# Patient Record
Sex: Female | Born: 1965 | Race: White | Hispanic: No | Marital: Married | Smoking: Former smoker
Health system: Southern US, Community
[De-identification: ages and names within clinical notes are randomized; demographics above are authoritative.]

## PROBLEM LIST (undated history)

## (undated) DIAGNOSIS — E785 Hyperlipidemia, unspecified: Secondary | ICD-10-CM

## (undated) DIAGNOSIS — E611 Iron deficiency: Secondary | ICD-10-CM

## (undated) DIAGNOSIS — I679 Cerebrovascular disease, unspecified: Secondary | ICD-10-CM

## (undated) DIAGNOSIS — N63 Unspecified lump in unspecified breast: Secondary | ICD-10-CM

## (undated) DIAGNOSIS — H409 Unspecified glaucoma: Secondary | ICD-10-CM

## (undated) DIAGNOSIS — N189 Chronic kidney disease, unspecified: Secondary | ICD-10-CM

## (undated) DIAGNOSIS — J449 Chronic obstructive pulmonary disease, unspecified: Secondary | ICD-10-CM

## (undated) DIAGNOSIS — E0500 Thyrotoxicosis with diffuse goiter without thyrotoxic crisis or storm: Secondary | ICD-10-CM

## (undated) DIAGNOSIS — I10 Essential (primary) hypertension: Secondary | ICD-10-CM

## (undated) DIAGNOSIS — C3490 Malignant neoplasm of unspecified part of unspecified bronchus or lung: Secondary | ICD-10-CM

## (undated) DIAGNOSIS — M318 Other specified necrotizing vasculopathies: Secondary | ICD-10-CM

## (undated) DIAGNOSIS — N184 Chronic kidney disease, stage 4 (severe): Secondary | ICD-10-CM

## (undated) DIAGNOSIS — D649 Anemia, unspecified: Secondary | ICD-10-CM

## (undated) DIAGNOSIS — N281 Cyst of kidney, acquired: Secondary | ICD-10-CM

## (undated) DIAGNOSIS — I779 Disorder of arteries and arterioles, unspecified: Secondary | ICD-10-CM

## (undated) DIAGNOSIS — I739 Peripheral vascular disease, unspecified: Secondary | ICD-10-CM

## (undated) DIAGNOSIS — N390 Urinary tract infection, site not specified: Secondary | ICD-10-CM

## (undated) DIAGNOSIS — I351 Nonrheumatic aortic (valve) insufficiency: Secondary | ICD-10-CM

## (undated) DIAGNOSIS — E538 Deficiency of other specified B group vitamins: Secondary | ICD-10-CM

## (undated) HISTORY — DX: Cyst of kidney, acquired: N28.1

## (undated) HISTORY — DX: Thyrotoxicosis with diffuse goiter without thyrotoxic crisis or storm: E05.00

## (undated) HISTORY — DX: Hyperlipidemia, unspecified: E78.5

## (undated) HISTORY — DX: Iron deficiency: E61.1

## (undated) HISTORY — DX: Chronic kidney disease, stage 4 (severe): N18.4

## (undated) HISTORY — DX: Other specified necrotizing vasculopathies: M31.8

## (undated) HISTORY — DX: Malignant neoplasm of unspecified part of unspecified bronchus or lung: C34.90

## (undated) HISTORY — DX: Urinary tract infection, site not specified: N39.0

## (undated) HISTORY — PX: COLONOSCOPY: SHX174

## (undated) HISTORY — DX: Chronic kidney disease, unspecified: N18.9

## (undated) HISTORY — PX: BRONCHOSCOPY: SUR163

## (undated) HISTORY — DX: Anemia, unspecified: D64.9

## (undated) HISTORY — DX: Chronic obstructive pulmonary disease, unspecified: J44.9

## (undated) HISTORY — DX: Unspecified glaucoma: H40.9

## (undated) HISTORY — DX: Disorder of arteries and arterioles, unspecified: I77.9

## (undated) HISTORY — DX: Unspecified lump in unspecified breast: N63.0

## (undated) HISTORY — DX: Nonrheumatic aortic (valve) insufficiency: I35.1

## (undated) HISTORY — DX: Cerebrovascular disease, unspecified: I67.9

## (undated) HISTORY — DX: Essential (primary) hypertension: I10

## (undated) HISTORY — DX: Peripheral vascular disease, unspecified: I73.9

## (undated) HISTORY — DX: Deficiency of other specified B group vitamins: E53.8

---

## 1992-02-24 HISTORY — PX: SPINAL FUSION: SHX223

## 1994-02-23 HISTORY — PX: ORIF ORBITAL FRACTURE: SHX5312

## 2007-01-31 ENCOUNTER — Encounter: Payer: Self-pay | Admitting: Cardiology

## 2007-04-19 ENCOUNTER — Ambulatory Visit: Payer: Self-pay | Admitting: Cardiology

## 2008-01-23 ENCOUNTER — Encounter: Payer: Self-pay | Admitting: Cardiology

## 2008-09-23 DIAGNOSIS — I679 Cerebrovascular disease, unspecified: Secondary | ICD-10-CM

## 2008-09-23 HISTORY — DX: Cerebrovascular disease, unspecified: I67.9

## 2008-10-19 ENCOUNTER — Ambulatory Visit: Payer: Self-pay | Admitting: Cardiology

## 2008-10-19 ENCOUNTER — Encounter: Payer: Self-pay | Admitting: Cardiology

## 2008-10-22 ENCOUNTER — Encounter: Payer: Self-pay | Admitting: Cardiology

## 2008-10-24 ENCOUNTER — Encounter: Payer: Self-pay | Admitting: Cardiology

## 2008-10-25 ENCOUNTER — Encounter: Payer: Self-pay | Admitting: Cardiology

## 2009-01-16 ENCOUNTER — Encounter: Payer: Self-pay | Admitting: Cardiology

## 2009-03-06 ENCOUNTER — Encounter: Payer: Self-pay | Admitting: Cardiology

## 2009-04-09 ENCOUNTER — Encounter: Payer: Self-pay | Admitting: Cardiology

## 2009-04-15 ENCOUNTER — Encounter: Payer: Self-pay | Admitting: Cardiology

## 2009-05-07 ENCOUNTER — Encounter: Payer: Self-pay | Admitting: Cardiology

## 2009-05-24 ENCOUNTER — Ambulatory Visit: Payer: Self-pay | Admitting: Cardiology

## 2009-05-24 DIAGNOSIS — I6529 Occlusion and stenosis of unspecified carotid artery: Secondary | ICD-10-CM

## 2009-05-24 DIAGNOSIS — I359 Nonrheumatic aortic valve disorder, unspecified: Secondary | ICD-10-CM | POA: Insufficient documentation

## 2009-05-28 ENCOUNTER — Ambulatory Visit: Payer: Self-pay | Admitting: Cardiology

## 2009-07-20 ENCOUNTER — Encounter: Payer: Self-pay | Admitting: Cardiology

## 2009-07-23 ENCOUNTER — Encounter (INDEPENDENT_AMBULATORY_CARE_PROVIDER_SITE_OTHER): Payer: Self-pay | Admitting: *Deleted

## 2009-08-05 ENCOUNTER — Encounter: Payer: Self-pay | Admitting: Cardiology

## 2009-11-18 ENCOUNTER — Ambulatory Visit: Payer: Self-pay | Admitting: Cardiology

## 2009-11-18 DIAGNOSIS — R945 Abnormal results of liver function studies: Secondary | ICD-10-CM

## 2009-11-18 DIAGNOSIS — E782 Mixed hyperlipidemia: Secondary | ICD-10-CM | POA: Insufficient documentation

## 2009-11-19 ENCOUNTER — Encounter: Payer: Self-pay | Admitting: Cardiology

## 2009-11-19 ENCOUNTER — Telehealth (INDEPENDENT_AMBULATORY_CARE_PROVIDER_SITE_OTHER): Payer: Self-pay | Admitting: *Deleted

## 2009-11-22 ENCOUNTER — Encounter: Payer: Self-pay | Admitting: Cardiology

## 2009-11-27 ENCOUNTER — Encounter (INDEPENDENT_AMBULATORY_CARE_PROVIDER_SITE_OTHER): Payer: Self-pay | Admitting: *Deleted

## 2010-03-25 NOTE — Consult Note (Signed)
Summary: CARDIOLOGY CONSULT/ [facility]  CARDIOLOGY CONSULT/ [facility]   Imported By: Zachary George 05/24/2009 11:25:04  _____________________________________________________________________  External Attachment:    Type:   Image     Comment:   External Document

## 2010-03-25 NOTE — Consult Note (Signed)
Summary: ONCOLOGY CONSULT  ONCOLOGY CONSULT   Imported By: Zachary George 05/24/2009 11:02:50  _____________________________________________________________________  External Attachment:    Type:   Image     Comment:   External Document

## 2010-03-25 NOTE — Letter (Signed)
Summary: Engineer, materials at [facility]  [address]   [place], [place] [address]   Phone: [phone]  Fax: [phone]        November 27, 2009 MRN: [id number]    CHANELE DOUGLAS [address] [place], [place]  [address]    Dear Ms. Hammersmith,  Your test ordered by Selena Batten has been reviewed by your physician (or physician assistant) and was found to be normal or stable. Your physician (or physician assistant) felt no changes were needed at this time.  ____ Echocardiogram  ____ Cardiac Stress Test  ____ Lab Work  __X__ Peripheral vascular study of arms, legs or neck  ____ CT scan or X-ray  ____ Lung or Breathing test  ____ Other:   Thank you.   Cyril Loosen, RN, BSN    Duane Boston, M.D., F.A.C.C. Thressa Sheller, M.D., F.A.C.C. Oneal Grout, M.D., F.A.C.C. Cheree Ditto, M.D., F.A.C.C. Daiva Nakayama, M.D., F.A.C.C. Kenney Houseman, M.D., F.A.C.C. Jeanne Ivan, PA-C

## 2010-03-25 NOTE — Letter (Signed)
Summary: Generic Engineer, agricultural at [facility] [address]   [place], [place] [address]   Phone: [phone]  Fax: [phone]        Jul 23, 2009 MRN: [id number]    ASENETH HACK [address] [place], [place]  [address]    Dear Ms. Rozzell,   According to our records, you are due for lab work to check your cholesterol and liver function following the addition of Simvastatin to your medications in April.   Please, take the enclosed order to the [facility] at your earliest convenience to have this lab work drawn. Do not eat or drink after midnight.       Sincerely,  Cyril Loosen, RN, BSN  This letter has been electronically signed by your physician.  Appended Document: Orders Update    Clinical Lists Changes  Orders: Added new Test order of T-Hepatic Function [phone]) - Signed Added new Test order of T-Lipid Profile [phone]) - Signed

## 2010-03-25 NOTE — Assessment & Plan Note (Signed)
Summary: EST-LAST SEEN 2009 HAS BEEN IN [facility]   Visit Type:  Hospital follow up Referring Provider:  Dr. Isabel Caprice Primary Provider:  Dr. Doreen Beam   History of Present Illness: 45 year old woman presents for a hospital followup visit. I saw her back in 2009 for cardiology consultation, not since that time. She was seen back in August of 2010 by Dr. Jens Som and Dr. Andee Lineman during inpatient admission at [facility] with a posterior circulation stroke. She was noted to have 50-70% bilateral internal carotid artery stenoses at that time, and echocardiography demonstrated an LVEF of 55-60% without wall motion abnormality, no left atrial appendage thrombus, moderate aortic regurgitation, and trace mitral regurgitation. No other intracardiac masses were noted. It was indicated at that time that the patient should have followup for her carotid artery disease, and otherwise would need a repeat echocardiogram over the next 6 months to assess her aortic valve.  She has been followed with anemia and systemic vasculitis by Dr. Corliss Skains and sees Dr. Pearlean Brownie for neurology followup. She indicates that she had carotid Dopplers done in followup since August, but cannot recall the details. She has a visit with him next month.  Ms. Hitch is transitioning her Coumadin followup from Dr. Cleone Slim and Dr. Sherril Croon. Today we discussed her cardiac testing from last year, including her aortic regurgitation, and findings of no specific cardiac dysrhythmia. She has had no palpitations or chest pain. She has NYHA class I dyspnea and exertion.  Current Medications (verified): 1)  Folic Acid 1 Mg Tabs (Folic Acid) .... Take 1 Tablet By Mouth Once A Day 2)  Metoprolol Tartrate 25 Mg Tabs (Metoprolol Tartrate) .... Take 1/2 Tablet By Mouth Once A Day 3)  Plaquenil 200 Mg Tabs (Hydroxychloroquine Sulfate) .... Take 1 Tablet By Mouth Once A Day 4)  Coumadin 5 Mg Tabs (Warfarin Sodium) .... Take 1 Tablet By Mouth Once A  Day 5)  Tramadol Hcl 50 Mg Tabs (Tramadol Hcl) .... Take 2 Tablet By Mouth Once A Day As Needed 6)  Proair Hfa 108 (90 Base) Mcg/act Aers (Albuterol Sulfate) .... As Needed 7)  Prilosec Otc 20 Mg Tbec (Omeprazole Magnesium) .... Take 1 Tablet By Mouth Once A Day  Allergies (verified): 1)  ! Sulfa  Comments:  Nurse/Medical Assistant: The patient's medications and allergies were reviewed with the patient and were updated in the Medication and Allergy Lists. List reviewed.  Past History:  Social History: Last updated: 05/20/2009 Married  Tobacco Use - Yes Alcohol Use - yes Regular Exercise - no  Past Medical History: Aortic Insufficiency - mild to moderate Asthma Reported breast nodule Anemia Systemic vasculitis, not otherwise specified - Dr. 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         She reports improved appetite, and appropriate weight gain. No orthopnea or PND. Otherwise reviewed and negative.   Vital Signs:  Patient profile:   45 year old female Height:      61 inches Weight:      109 pounds BMI:     20.67 Pulse rate:   64 / minute BP sitting:   166 / 71  (left arm) Cuff size:   regular  Vitals Entered By: Carlye Grippe (May 24, 2009 1:29 PM)  Physical Exam  Additional Exam:  Thin middle-aged woman in no acute distress.  HEENT: Conjunctivae was normal, oropharynx clear. Neck: Supple, no elevated jugular venous pressure, bilateral carotid bruits noted. Lungs: Clear to auscultation, nonlabored. Cardiac: Regular rate and rhythm, 2/6 systolic murmur at the base with soft diastolic murmur in the upper left sternal border, no S3. Abdomen: Soft, nontender, bowel sounds present. Extremities: No pitting edema, pulses 2+. Skin: Warm and  dry. Musculoskeletal: No kyphosis. Neuropsychiatric: Alert and oriented x3, affect appropriate.    TE Echocardiogram  Procedure date:  10/24/2008  Findings:      Transesophageal echocardiogram:  LVEF 55-60%, no intracardiac mass, no PFO, no left atrial appendage thrombus, moderate aortic regurgitation, trace mitral regurgitation, trace tricuspid regurgitation.  EKG  Procedure date:  05/24/2009  Findings:      Sinus bradycardia at 59 beats per minute.  Impression & Recommendations:  Problem # 1:  AORTIC VALVE DISORDERS (ICD-424.1)  Patient with known moderate aortic regurgitation, with long-standing cardiac murmur by history. Both transthoracic and transesophageal echocardiogram reports were reviewed from last year. Dr. Andee Lineman recommended a followup echocardiogram in 6 months during his inpatient evaluation. This will be arranged. I will otherwise plan to see her back clinically in 6 months.  Her updated medication list for this problem includes:    Metoprolol Tartrate 25 Mg Tabs (Metoprolol tartrate) .Marland Kitchen... Take 1/2 tablet by mouth once a day  Orders: 2-D Echocardiogram (2D Echo) T-Lipid Profile ([id number]) T-Hepatic Function [phone])  Problem # 2:  CAROTID ARTERY DISEASE (ICD-433.10)  Moderate bilateral internal carotid artery disease, 50-70% by carotid Dopplers last August. This has reportedly been followed up by Dr. Pearlean Brownie since that time, details pending. We will request records for review. She continues on Coumadin with prior history of posterior circulation stroke. She has had no definite dysrhythmia documented and is in sinus rhythm today. No intracardiac thrombi were noted on transesophageal echocardiogram from last August. She reports a pending visit with Dr. Pearlean Brownie next month. Coumadin is to be followed by Dr. Sherril Croon per patient report. She is transitioning this followup from Dr. Cleone Slim to Dr. Sherril Croon. We will arrange a fasting lipid profile and liver function tests, as  aggressive LDL management is warranted.  Her updated medication list for this problem includes:    Coumadin 5 Mg Tabs (Warfarin sodium) .Marland Kitchen... Take 1 tablet by mouth once a day  Orders: 2-D Echocardiogram (2D Echo) T-Lipid Profile ([id number]) T-Hepatic Function ([id number])  Other Orders: EKG w/ Interpretation ([id number])  Patient Instructions: 1)  Your physician wants you to follow-up in: 6 months. You will receive a reminder letter in the mail one-two months in advance. If you don't receive a letter, please call our office to schedule the follow-up appointment. 2)  Your physician recommends that you go to the [facility] for a FASTING lipid profile and liver function labs: DO NOT EAT OR DRINK AFTER MIDNIGHT. You may also do this at [facility] at the same time as your Echo. 3)  Your physician has requested that you have an echocardiogram.  Echocardiography is a painless test that uses sound waves to create images of your heart. It provides your doctor with information about the size and shape of your heart and how well your heart's chambers and valves are working.  This procedure takes approximately one hour. There are no restrictions for this procedure.

## 2010-03-25 NOTE — Letter (Signed)
Summary: External Correspondence/ [facility]  External Correspondence/ [facility]   Imported By: Dorise Hiss 05/24/2009 16:48:07  _____________________________________________________________________  External Attachment:    Type:   Image     Comment:   External Document

## 2010-03-25 NOTE — Letter (Signed)
Summary: [facility] Calkin - [facility]-OFFICE NOTE  [facility] Calkin - [facility]-OFFICE NOTE   Imported By: Claudette Laws 05/23/2009 15:18:54  _____________________________________________________________________  External Attachment:    Type:   Image     Comment:   External Document

## 2010-03-25 NOTE — Letter (Signed)
Summary: [facility] D/C DR. VYAS  [facility] D[facility]C DR. [facility]YAS   Imported By: Zachary George 05/24/2009 11:45:59  _____________________________________________________________________  External Attachment:    Type:   Image     Comment:   External Document

## 2010-03-25 NOTE — Progress Notes (Signed)
Summary: Carotid  ---- Converted from flag ---- ---- 11/19/2009 12:19 PM, Loreli Slot, MD, [facility] wrote: [facility] neurology sent copies of a prior transcranial Doppler report from 2010. Has she actually had followup carotid Dopplers within the last 6 months? If not, these need to be arranged. ------------------------------  Spoke with medical records at [facility]. Pt has not had recent carotid. Carotid will be ordered and pt will be notified of plan.  Appended Document: Orders Update    Clinical Lists Changes  Orders: Added new Referral order of Carotid Duplex (Carotid Duplex) - Signed

## 2010-03-25 NOTE — Letter (Signed)
Summary: External Correspondence/ OFFICE NOTE [facility]  External Correspondence/ OFFICE NOTE [facility]   Imported By: Dorise Hiss 11/19/2009 11:50:27  _____________________________________________________________________  External Attachment:    Type:   Image     Comment:   External Document

## 2010-03-25 NOTE — Assessment & Plan Note (Signed)
Summary: 6 mo fu   Visit Type:  Follow-up Referring Provider:  Dr. Isabel Caprice Primary Provider:  Dr. Doreen Beam   History of Present Illness: 45 year old Tasha [name] presents for followup. I saw her back in April.  Followup labs from 5 April showed AST 42, ALT 64, cholesterol 204, triglycerides 80, HDL 59, LDL 129. Simvastatin 10 mg daily was started. Followup labs from 27 May showed AST 345 and ALT 369. Simvastatin was discontinued. Followup labs from 13 June showed AST 61, ALT 112. She saw Dr. Karilyn Cota as well, had an abdominal ultrasound, and states that no acute issues were noted.  She reports no new symptoms. Continues to followup with Dr. Pearlean Brownie in [place]. States that she did have followup carotids done.  Coumadin is followed by Dr. Sherril Croon.  Today we discussed followup labs to reassess function tests and cholesterol, with plans to consider perhaps Pravachol or Crestor.  Preventive Screening-Counseling & Management  Alcohol-Tobacco     Smoking Status: quit     Year Quit: 10/19/08  Current Medications (verified): 1)  Folic Acid 1 Mg Tabs (Folic Acid) .... Take 1 Tablet By Mouth Once A Day 2)  Metoprolol Tartrate 25 Mg Tabs (Metoprolol Tartrate) .... Take 1/2 Tablet By Mouth Twice A Day 3)  Plaquenil 200 Mg Tabs (Hydroxychloroquine Sulfate) .... Take 1 Tablet By Mouth Once A Day 4)  Coumadin 5 Mg Tabs (Warfarin Sodium) .... Take 1 Tablet By Mouth Once A Day 5)  Tramadol Hcl 50 Mg Tabs (Tramadol Hcl) .... Take 2 Tablet By Mouth Once A Day As Needed 6)  Proair Hfa 108 (90 Base) Mcg/act Aers (Albuterol Sulfate) .... As Needed 7)  Iron 325 (65 Fe) Mg Tabs (Ferrous Sulfate) .... Take 1 Tablet By Mouth Once A Day 8)  Voltaren 1 % Gel (Diclofenac Sodium) .... Apply Topically Three Times A Day  Allergies (verified): 1)  ! Sulfa  Comments:  Nurse/Medical Assistant: The patient's medication list and allergies were reviewed with the patient and were updated in the Medication and Allergy  Lists.  Past History:  Past Medical History: Last updated: 05/24/2009 Aortic Insufficiency - mild to moderate Asthma Reported breast nodule Anemia Systemic vasculitis, not otherwise specified - Dr. 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         Otherwise reviewed and negative.  Vital Signs:  Patient profile:   45 year old female Height:      61 inches Weight:      111 pounds Pulse rate:   56 / minute BP sitting:   141 / 59  (left arm) Cuff size:   regular  Vitals Entered By: Carlye Grippe (November 18, 2009 8:48 AM)  Physical Exam  Additional Exam:  Thin middle-aged Tasha [name] in no acute distress. HEENT: Conjunctivae was normal, oropharynx clear. Neck: Supple, no elevated jugular venous pressure, bilateral carotid bruits noted. Lungs: Clear to auscultation, nonlabored. Cardiac: Regular rate and rhythm, 2/6 systolic murmur at the base with soft diastolic murmur in the upper left sternal border, no S3. Abdomen: Soft, nontender, bowel sounds present. Extremities: No pitting edema, pulses 2+. Skin: Warm and dry. Musculoskeletal: No kyphosis. Neuropsychiatric: Alert and oriented x3, affect appropriate.  Echocardiogram  Procedure date:  05/28/2009  Findings:      Normal LV chamber size (LVEDD 44 mm), normal wall thickness, LVEF 55-60%, diastolic dysfunction, mild mitral regurgitation, mildly thickened aortic valve with moderate aortic regurgitation and no stenosis, mean gradient 9 mm mercury, normal RV function with trace tricuspid regurgitation, RVSP 23 mm mercury.  Impression &  Recommendations:  Problem # 1:  AORTIC VALVE DISORDERS (ICD-424.1)  Moderate aortic regurgitation, stable by echocardiogram in April. Plan followup echocardiogram prior to next visit in 6 months.  Her updated medication list for this problem includes:    Metoprolol Tartrate 25 Mg Tabs (Metoprolol tartrate) .Marland Kitchen... Take 1/2 tablet by mouth twice a day  Problem # 2:  CAROTID ARTERY DISEASE (ICD-433.10)  Followed by Dr. Pearlean Brownie. Willl request report of repeat Doppler studies for review. Coumadin is followed by Dr. Sherril Croon.  Her updated medication list for this problem includes:    Coumadin 5 Mg Tabs (Warfarin sodium) .Marland Kitchen... Take 1 tablet by mouth once a day  Problem # 3:  MIXED HYPERLIPIDEMIA (ICD-272.2)  Plan to consider either Pravachol or Crestor depending on followup liver function tests and cholesterol numbers.  The following medications were removed from the medication list:    Simvastatin 10 Mg Tabs (Simvastatin) .Marland Kitchen... Take one tablet by mouth daily at bedtime  Orders: T-Lipid Profile ([id number]) T-Hepatic Function [phone])  Problem # 4:  LIVER FUNCTION TESTS, ABNORMAL, HX OF (ICD-V12.2)  Presumed to be related to simvastatin. Improvement noted based on most recent labs in June. Patient reports no acute abdominal issues in followup with Dr. Karilyn Cota. Plan repeat liver function studies for new baseline prior to considering any additional statin therapy.  Orders: T-Lipid Profile [phone]) T-Hepatic Function [phone])  Patient Instructions: 1)  Your physician recommends that you go to the [facility] for a FASTING lipid profile and liver function labs. Do not eat or drink after midnight.  2)  Your physician has requested that you have an echocardiogram.  Echocardiography is a painless test that uses sound waves to create images of your heart. It provides your doctor with information about the size and shape of your heart and how well your heart's chambers and valves are  working.  This procedure takes approximately one hour. There are no restrictions for this procedure. THIS WILL BE DONE IN 6 MONTHS BEFORE YOUR NEXT APPT. 3)  We will request the results of your recent carotid doppler from Dr. Pearlean Brownie. 4)  Your physician wants you to follow-up in: 6 months. You will receive a reminder letter in the mail one-two months in advance. If you don't receive a letter, please call our office to schedule the follow-up appointment.

## 2010-07-11 NOTE — Letter (Signed)
March 19, 2007    Tasha Beam, MD  [address]  [place], [place] [address]   RE:  Tasha, [name]  MRN:  [id number]  /  DOB:  [birth year]   Thank you for your referral of Tasha [name].  As you know, Tasha [name] is a 45-year-  old woman with a fairly longstanding sense of rapid heart rate.  In  reviewing her medical history, Tasha [name] states that Tasha [name] has no documented  history of cardiovascular disease or congestive heart failure.  Tasha [name], in  fact, had a recent echocardiogram done through your office in December  2008 demonstrating normal ventricular systolic function at 60-65%  without segmental wall motion abnormalities.  Tasha [name] was noted to have a  mildly sclerotic aortic valve with mild to moderate aortic  regurgitation.  Mild mitral regurgitation was also noted as well as mild  tricuspid regurgitation.  Tasha [name] reports to me a history of rapid heart  rate and dyspnea on exertion for the last few years.  Tasha [name] notices this  with going up steps and taking her dog outside.  Tasha [name] has no significant  exertional chest pain associated with this.  Tasha [name] had no frank syncope.  Tasha [name] does report feeling dizzy when Tasha [name] stands up abruptly.   I note that Tasha [name] has had documentation of hyponatremia as well as  hyperkalemia based on blood work from November.  Her sodium at that time  was 129 with a potassium of 5.4, BUN of 15 and a creatinine of 1.43.  Her chloride was also low at 95, and her bicarb was low at 19.  Hemoglobin was 11.2 with an MCV of 108, and platelets were 279.  Total  cholesterol was 164 with an LDL of 62.  Her TSH was normal at 2.67.  Tasha [name]  states that Tasha [name] has tried to liberalize salt in her diet at your  direction, and her sodium has subsequently improved, although I do not  have the followup blood work results.   As far as chronic medical conditions, Tasha [name] denied any problems with  hypertension or diabetes.  Tasha [name] does state that Tasha [name] drinks alcohol  regularly, splitting a 12-pack with her husband on a daily  basis.  Tasha [name]  has been drinking alcohol in this fashion for at least the last 4 years  since Tasha [name] lost her job.  Tasha [name] reports problems with bronchial asthma  and has to use an inhaler for this intermittently.  Tasha [name] also has a  longstanding history of tobacco use.  Parenthetically, Tasha [name] also tells me  that Tasha [name] had some type of breast biopsy approximately 2 years ago and  that Tasha [name] never followed up on the results.  Tasha [name] states that Tasha [name] has a  known breast nodule.   ALLERGIES:  SULFA DRUGS (hives).   PRESENT MEDICATIONS:  Include ProAir inhaler p.r.n., multivitamin,  aspirin p.r.n., otherwise no regular medications.   PAST MEDICAL HISTORY:  As discussed above.  The patient also reports a  spinal fusion at C3, C4 and C5 back in 1994 and a blowout fracture of  her left orbit in 1996.   SOCIAL HISTORY:  The patient is married.  Tasha [name] has two biological  children and one stepchild.  Tasha [name] is presently out of work.  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  Tasha [name] has a  longstanding tobacco use history of a pack per day for at least 28  years.  Tasha [name] is drinking approximately 6 beers a day and has done  so for  the last 4 years.  Tasha [name] does not exercise with any regularity.   FAMILY HISTORY:  Was reviewed.  The patient states that her mother is  living at age 44.  Her father died at age 20 with history of heart  attacks and an allergy to heparin. Tasha [name] has a sister who died in a car  accident at age 19 and two brothers living in their mid 66s with no  reported major medical conditions.   REVIEW OF SYSTEMS:  Is discussed above.  The patient feels generally  fatigued.  Tasha [name] has problems with intermittent wheezing and cites her  history of bronchial asthma.  Tasha [name] uses her inhaler intermittently.  Tasha [name]  states that Tasha [name] feels dizzy sometimes when Tasha [name] stands up but has had no  frank syncope.  Tasha [name] also reports problems with stress and anxiety.  Tasha [name]  has had problems with spontaneous nosebleeds  and has trouble with emesis  and nausea intermittently.  Tasha [name] also reports Tasha [name] has been bruising  easily.  Otherwise, systems are negative.   PHYSICAL EXAMINATION:  VITAL SIGNS:  The patient is not frankly  orthostatic by blood pressure.  Her supine blood pressure was 155/82,  decreasing to 130/83 after being seated and up to 141/80 after 5 minutes  of standing.  Heart rate did increase from 102 supine up to 132 after  standing for 5 minutes.  Tasha [name] felt some dizziness when Tasha [name] first sat up  after and standing for a few minutes.  Oxygen saturation was 96% on room  air.  GENERAL:  This is a thin, cachectic-appearing woman in no acute  distress.  Tasha [name] weighs approximately 80 pounds and states that Tasha [name] lost  at least 20 pounds over the last few years.  HEENT:  Conjunctiva and lids are normal.  Oropharynx clear with poor  dentition.  NECK:  Supple.  No loud bruits or elevated venous pressure. No obvious  thyromegaly or thyroid tenderness noted.  LUNGS:  Essentially clear. Somewhat coarse breath sounds.  No wheezing  or rhonchi.  CARDIAC:  Exam reveals a regular rate and rhythm.  No obvious diastolic  murmur or S3 gallop.  ABDOMEN:  Soft.  No pulsatile abdominal mass.  Liver edge palpable just  at the costal margin.  No tenderness noted.  Bowel sounds are present.  EXTREMITIES:  Exhibit no pitting edema.  Distal pulses are 1-2+.  SKIN:  Warm and dry. Area of ecchymosis noted on the left forearm.  The  patient states that this was itching, and Tasha [name] scratched the area,  resulting in the amount of bruising.  MUSCULOSKELETAL:  No kyphosis is noted.  NEUROPSYCHIATRIC:  The patient is alert and oriented x3   A 12-lead electrocardiogram today shows sinus tachycardia at 122 beats  per minute with normal intervals, possibly biatrial enlargement,  although this was not described on the patient's recent echocardiogram   IMPRESSION AND RECOMMENDATIONS:  Tachycardia, specifically sinus  tachycardia  based on today's examination, and possibly longstanding.  The patient has normal left ventricular systolic function based on  recent echocardiogram and does have mild to moderate aortic  regurgitation, although I suspect that this is not specifically related  to the patient's present symptom complex and tachycardia.  Frankly, I am  concerned more about some other type of underlying systemic process that  is not yet diagnosed.  Her electrolyte abnormalities raise the  possibility of SIADH or perhaps even a Type 4 renal tubular acidosis.  Tasha [name] is not  on any diuretic therapy at this time.  Complicating matters  is a longstanding history of both alcohol abuse and tobacco abuse.  Would have concerns about an underlying malignancy, perhaps breast  cancer given the patient's report of a previous breast mass with biopsy  that Tasha [name] never followed up on, or perhaps lung cancer.  Her TSH was  already checked and found to be within normal limits.  At this point, I  would not necessarily recommend further cardiology evaluation.  Rather,  I think further investigation into underlying possibilities already  discussed would be reasonable.  Would consider checking an erythrocyte  sedimentation rate and a set of blood cultures to exclude any obvious  bacteremia, although I think underlying endocarditis is unlikely.  Imaging of the chest and perhaps even the abdomen would be within  reason, and further investigating the patient's cortisol level,  aldosterone, and renin activity, as well as followup CBC and CMET might  be a reasonable start.  I think generally liberalizing her sodium and  fluid intake is reasonable at this point as Tasha [name] does have some evidence  of volume contraction, although the real question is why, and continuing  to search for underlying causes is the main issue.  As I spoke with you  by phone, I will be sending her back to you to lead this evaluation.  From a cardiac perspective, I would  consider a followup echocardiogram  in one year's time, although no other specific cardiac testing is  planned at this point.  We can see her back as needed.    Sincerely,     Jonelle Sidle, MD  Electronically Signed   SGM/MedQ  DD: 04/19/2007  DT: 04/19/2007  Job #: [id number]

## 2010-08-19 ENCOUNTER — Encounter: Payer: Self-pay | Admitting: Cardiology

## 2015-07-29 ENCOUNTER — Ambulatory Visit: Payer: Self-pay | Admitting: "Endocrinology

## 2015-10-19 LAB — BASIC METABOLIC PANEL
BUN: 26 mg/dL — AB (ref 4–21)
Creatinine: 2.2 mg/dL — AB (ref 0.5–1.1)
GLUCOSE: 93 mg/dL
POTASSIUM: 4.8 mmol/L (ref 3.4–5.3)
SODIUM: 125 mmol/L — AB (ref 137–147)

## 2015-10-19 LAB — HEPATIC FUNCTION PANEL
ALT: 12 U/L (ref 7–35)
AST: 30 U/L (ref 13–35)
Alkaline Phosphatase: 57 U/L (ref 25–125)
Bilirubin, Total: 0.5 mg/dL

## 2015-10-19 LAB — CBC AND DIFFERENTIAL
HCT: 29 % — AB (ref 36–46)
Hemoglobin: 10 g/dL — AB (ref 12.0–16.0)
Platelets: 335 10*3/uL (ref 150–399)
WBC: 7.1 10^3/mL

## 2016-01-04 DIAGNOSIS — E0500 Thyrotoxicosis with diffuse goiter without thyrotoxic crisis or storm: Secondary | ICD-10-CM | POA: Insufficient documentation

## 2016-01-04 DIAGNOSIS — J42 Unspecified chronic bronchitis: Secondary | ICD-10-CM | POA: Insufficient documentation

## 2016-01-04 DIAGNOSIS — M359 Systemic involvement of connective tissue, unspecified: Secondary | ICD-10-CM | POA: Insufficient documentation

## 2016-01-04 DIAGNOSIS — H409 Unspecified glaucoma: Secondary | ICD-10-CM | POA: Insufficient documentation

## 2016-01-04 DIAGNOSIS — Z8673 Personal history of transient ischemic attack (TIA), and cerebral infarction without residual deficits: Secondary | ICD-10-CM | POA: Insufficient documentation

## 2016-01-04 DIAGNOSIS — N184 Chronic kidney disease, stage 4 (severe): Secondary | ICD-10-CM | POA: Insufficient documentation

## 2016-01-04 DIAGNOSIS — Z79899 Other long term (current) drug therapy: Secondary | ICD-10-CM | POA: Insufficient documentation

## 2016-01-04 DIAGNOSIS — I10 Essential (primary) hypertension: Secondary | ICD-10-CM | POA: Insufficient documentation

## 2016-01-04 DIAGNOSIS — E785 Hyperlipidemia, unspecified: Secondary | ICD-10-CM | POA: Insufficient documentation

## 2016-01-04 DIAGNOSIS — M47812 Spondylosis without myelopathy or radiculopathy, cervical region: Secondary | ICD-10-CM | POA: Insufficient documentation

## 2016-01-04 NOTE — Progress Notes (Signed)
Office Visit Note  Patient: Tasha [name]             Date of Birth: 1965-08-31           MRN: [id number]             PCP: Glenda Chroman, MD Referring: No ref. provider found Visit Date: 01/07/2016 Occupation: unemployed    Subjective:  Generalized pain   History of Present Illness: Tasha [name] is a 50 y.o. female with history of autoimmune disease some she states she has been doing fairly well except for him some fatigue. She has some generalized pain but no joint swelling. She states her GFR has gone down and she's been seeing her nephrologist more often. She is very much concerned about all the medication she is taking and would like to get off the Plaquenil. Her blood pressure is better controls since some the dose of her medications have been increased.   Activities of Daily Living:  Patient reports morning stiffness for 0 minutes.   Patient Denies nocturnal pain.  Difficulty dressing/grooming: Denies Difficulty climbing stairs: Denies Difficulty getting out of chair: Denies Difficulty using hands for taps, buttons, cutlery, and/or writing: Denies   Review of Systems  Constitutional: Negative for fatigue, night sweats, weight gain, weight loss and weakness.  HENT: Negative for mouth sores, trouble swallowing, trouble swallowing, mouth dryness and nose dryness.   Eyes: Negative for pain, redness, visual disturbance and dryness.  Respiratory: Negative for cough, shortness of breath and difficulty breathing.   Cardiovascular: Negative for chest pain, palpitations, hypertension, irregular heartbeat and swelling in legs/feet.  Gastrointestinal: Negative for blood in stool, constipation and diarrhea.  Endocrine: Negative for increased urination.  Genitourinary: Negative for vaginal dryness.  Musculoskeletal: Positive for arthralgias and joint pain. Negative for joint swelling, myalgias, muscle weakness, morning stiffness, muscle tenderness and myalgias.  Skin: Negative for color  change, rash, hair loss, skin tightness, ulcers and sensitivity to sunlight.  Allergic/Immunologic: Negative for susceptible to infections.  Neurological: Negative for dizziness, memory loss and night sweats.  Hematological: Negative for swollen glands.  Psychiatric/Behavioral: Negative for depressed mood and sleep disturbance. The patient is not nervous/anxious.     PMFS History:  Patient Active Problem List   Diagnosis Date Noted  . Renal cyst 01/06/2016  . Autoimmune disease  01/04/2016  . High risk medication use 01/04/2016  . Chronic kidney disease (CKD) stage G4 01/04/2016  . DJD (degenerative joint disease), cervical 01/04/2016  . History of TIA (transient ischemic attack) 01/04/2016  . Chronic bronchitis ([place]) 01/04/2016  . Essential hypertension 01/04/2016  . Dyslipidemia 01/04/2016  . Glaucoma 01/04/2016  . Graves disease 01/04/2016  . MIXED HYPERLIPIDEMIA 11/18/2009  . LIVER FUNCTION TESTS, ABNORMAL, HX OF 11/18/2009  . AORTIC VALVE DISORDERS 05/24/2009  . CAROTID ARTERY DISEASE 05/24/2009    Past Medical History:  Diagnosis Date  . Anemia   . Aortic insufficiency    Mild to moderate  . Asthma   . Asthma   . Breast nodule   . Cardiac arrhythmia   . Carotid artery occlusion    Carotid artery disease  . 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 COPD (chronic obstructive pulmonary disease) ([place])   . CRI (chronic renal insufficiency)   . Folic acid deficiency   . Glaucoma   . Graves disease   . Hyperlipidemia   . Hypertension   . Iron deficiency   . Recurrent UTI   . Renal cyst   .  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 Sonny Dandy    No family history on file. Past Surgical History:  Procedure Laterality Date  . ORIF ORBITAL FRACTURE  1996   Left  . SPINAL FUSION  1994   At C3-5   Social History   Social History Narrative   No regular exercise.     Objective: Vital Signs: BP (!) 127/56   Pulse 64    Resp 14   Ht '5\' 1"'$  (1.549 m)   Wt 94 lb (42.6 kg)   BMI 17.76 kg/m    Physical Exam  Constitutional: She is oriented to person, place, and time. She appears well-developed and well-nourished.  HENT:  Head: Normocephalic and atraumatic.  Eyes: Conjunctivae and EOM are normal.  Neck: Normal range of motion.  Cardiovascular: Normal rate, regular rhythm, normal heart sounds and intact distal pulses.   Pulmonary/Chest: Effort normal and breath sounds normal.  Abdominal: Soft. Bowel sounds are normal.  Lymphadenopathy:    She has no cervical adenopathy.  Neurological: She is alert and oriented to person, place, and time.  Skin: Skin is warm and dry. Capillary refill takes less than 2 seconds.  Psychiatric: She has a normal mood and affect. Her behavior is normal.  Nursing note and vitals reviewed.    Musculoskeletal Exam: C-spine limited range of motion and thoracic lumbar spine good range of motion. Shoulder joints, elbow joints, wrist joints, MCPs, PIPs, DIPs good range of motion with no synovitis. Hip joints, knee joints, ankle joints, MTPs PIPs DIPs with good range of motion with no synovitis. Fibromyalgia tender points are all absent.  CDAI Exam: No CDAI exam completed.    Investigation: Findings:  10/18/2015: CBC hemoglobin 10.0, CMP sodium 125, creatinine 2.18, GFR 25    Imaging: No results found.  Speciality Comments: No specialty comments available.    Procedures:  No procedures performed Allergies: Sulfonamide derivatives   Assessment / Plan: Visit Diagnoses: Autoimmune disease  - +ANA+RNP+beta2,highESR,later all autoimmune labs negative in May 2017. She never developed any symptoms of autoimmune disease except for initial issues with him his stroke. As per her wishes will discontinue Plaquenil she's been on low-dose 100 mg a day only. I did make her aware of the symptoms of autoimmune disease. If she develops any new symptoms she supposed to notify us. Otherwise  I'll check her labs today and then in 4 months along with autoimmune antibodies.- Plan: Urinalysis, Routine w reflex microscopic (not at [facility]), ANA, Sedimentation rate, C3 and C4, Anti-DNA antibody, double-stranded, Beta-2 glycoprotein antibodies, RNP Antibody  High risk medication use - Plaquenil 200 mg half tablet daily. According to her thigh exams have been negative for Plaquenil toxicity. She will discontinue Plaquenil today. - Plan: CBC with Differential/Platelet, COMPLETE METABOLIC PANEL WITH GFR, COMPLETE METABOLIC PANEL WITH GFR, CBC with Differential/Platelet  Chronic kidney disease (CKD) stage G4 - GFR 23, proteinuria, anemia, secondary to atherosclerosis followed up by nephrologist.  DJD (degenerative joint disease), cervical - Status post fusion.  History of TIA (transient ischemic attack) - Right occipital thalamic infarct. She is on Coumadin.  COPD - Former smoker  Essential hypertension  Dyslipidemia  Glaucoma, unspecified glaucoma type, unspecified laterality  Graves disease  Renal cyst  Anemia  - Iron deficiency, B12 deficiency, CKD    Orders: Orders Placed This Encounter  Procedures  . CBC with Differential/Platelet  . COMPLETE METABOLIC PANEL WITH GFR  . Urinalysis, Routine w reflex microscopic (not at [facility])  . ANA  .  Sedimentation rate  . C3 and C4  . Anti-DNA antibody, double-stranded  . Beta-2 glycoprotein antibodies  . RNP Antibody  . COMPLETE METABOLIC PANEL WITH GFR  . CBC with Differential/Platelet   No orders of the defined types were placed in this encounter.   Face-to-face time spent with patient was 30 minutes. 50% of time was spent in counseling and coordination of care.  Follow-Up Instructions: Return in about 5 months (around 06/06/2016) for Autoimmune disease.   Bo Merino, MD

## 2016-01-06 ENCOUNTER — Encounter: Payer: Self-pay | Admitting: *Deleted

## 2016-01-06 DIAGNOSIS — N281 Cyst of kidney, acquired: Secondary | ICD-10-CM | POA: Insufficient documentation

## 2016-01-07 ENCOUNTER — Ambulatory Visit (INDEPENDENT_AMBULATORY_CARE_PROVIDER_SITE_OTHER): Payer: 59 | Admitting: Rheumatology

## 2016-01-07 ENCOUNTER — Encounter: Payer: Self-pay | Admitting: Rheumatology

## 2016-01-07 VITALS — BP 127/56 | HR 64 | Resp 14 | Ht 61.0 in | Wt 94.0 lb

## 2016-01-07 DIAGNOSIS — Z79899 Other long term (current) drug therapy: Secondary | ICD-10-CM | POA: Diagnosis not present

## 2016-01-07 DIAGNOSIS — Z8673 Personal history of transient ischemic attack (TIA), and cerebral infarction without residual deficits: Secondary | ICD-10-CM

## 2016-01-07 DIAGNOSIS — M359 Systemic involvement of connective tissue, unspecified: Secondary | ICD-10-CM | POA: Diagnosis not present

## 2016-01-07 DIAGNOSIS — D631 Anemia in chronic kidney disease: Secondary | ICD-10-CM

## 2016-01-07 DIAGNOSIS — H409 Unspecified glaucoma: Secondary | ICD-10-CM

## 2016-01-07 DIAGNOSIS — J42 Unspecified chronic bronchitis: Secondary | ICD-10-CM | POA: Diagnosis not present

## 2016-01-07 DIAGNOSIS — M5030 Other cervical disc degeneration, unspecified cervical region: Secondary | ICD-10-CM | POA: Diagnosis not present

## 2016-01-07 DIAGNOSIS — E0500 Thyrotoxicosis with diffuse goiter without thyrotoxic crisis or storm: Secondary | ICD-10-CM

## 2016-01-07 DIAGNOSIS — E785 Hyperlipidemia, unspecified: Secondary | ICD-10-CM

## 2016-01-07 DIAGNOSIS — N184 Chronic kidney disease, stage 4 (severe): Secondary | ICD-10-CM | POA: Diagnosis not present

## 2016-01-07 DIAGNOSIS — N281 Cyst of kidney, acquired: Secondary | ICD-10-CM | POA: Diagnosis not present

## 2016-01-07 DIAGNOSIS — M47812 Spondylosis without myelopathy or radiculopathy, cervical region: Secondary | ICD-10-CM

## 2016-01-07 DIAGNOSIS — I10 Essential (primary) hypertension: Secondary | ICD-10-CM

## 2016-01-07 LAB — CBC WITH DIFFERENTIAL/PLATELET
BASOS PCT: 0 %
Basophils Absolute: 0 cells/uL (ref 0–200)
EOS ABS: 58 {cells}/uL (ref 15–500)
EOS PCT: 1 %
HCT: 29.2 % — ABNORMAL LOW (ref 35.0–45.0)
Hemoglobin: 9.9 g/dL — ABNORMAL LOW (ref 11.7–15.5)
LYMPHS PCT: 25 %
Lymphs Abs: 1450 cells/uL (ref 850–3900)
MCH: 34.6 pg — ABNORMAL HIGH (ref 27.0–33.0)
MCHC: 33.9 g/dL (ref 32.0–36.0)
MCV: 102.1 fL — AB (ref 80.0–100.0)
MONOS PCT: 9 %
MPV: 9.9 fL (ref 7.5–12.5)
Monocytes Absolute: 522 cells/uL (ref 200–950)
NEUTROS ABS: 3770 {cells}/uL (ref 1500–7800)
Neutrophils Relative %: 65 %
PLATELETS: 383 10*3/uL (ref 140–400)
RBC: 2.86 MIL/uL — AB (ref 3.80–5.10)
RDW: 14 % (ref 11.0–15.0)
WBC: 5.8 10*3/uL (ref 3.8–10.8)

## 2016-01-08 ENCOUNTER — Telehealth: Payer: Self-pay | Admitting: Radiology

## 2016-01-08 LAB — COMPLETE METABOLIC PANEL WITH GFR
ALK PHOS: 58 U/L (ref 33–130)
ALT: 28 U/L (ref 6–29)
AST: 46 U/L — ABNORMAL HIGH (ref 10–35)
Albumin: 3.7 g/dL (ref 3.6–5.1)
BILIRUBIN TOTAL: 0.4 mg/dL (ref 0.2–1.2)
BUN: 41 mg/dL — ABNORMAL HIGH (ref 7–25)
CALCIUM: 9.2 mg/dL (ref 8.6–10.4)
CO2: 22 mmol/L (ref 20–31)
CREATININE: 3.3 mg/dL — AB (ref 0.50–1.05)
Chloride: 94 mmol/L — ABNORMAL LOW (ref 98–110)
GFR, EST AFRICAN AMERICAN: 18 mL/min — AB (ref >=60)
GFR, EST NON AFRICAN AMERICAN: 15 mL/min — AB (ref >=60)
Glucose, Bld: 98 mg/dL (ref 65–99)
Potassium: 6 mmol/L — ABNORMAL HIGH (ref 3.5–5.3)
Sodium: 129 mmol/L — ABNORMAL LOW (ref 135–146)
TOTAL PROTEIN: 6.3 g/dL (ref 6.1–8.1)

## 2016-01-08 NOTE — Progress Notes (Signed)
Please fax results to patient's nephrologist and call patients with the results

## 2016-01-08 NOTE — Telephone Encounter (Signed)
-----   Message from Bo Merino, MD sent at 01/08/2016  1:06 PM EST ----- Please fax results to patient's nephrologist and call patients with the results

## 2016-01-08 NOTE — Telephone Encounter (Signed)
Patient called back nephrology phone [phone] fax is [phone] have faxed labs

## 2016-01-08 NOTE — Telephone Encounter (Signed)
Advised patient. She will call me back with her contact information for her Nephrologist. I faxed to [facility] in error, called them back told them to disregard, they are not her nephrology office.

## 2016-06-01 NOTE — Progress Notes (Signed)
Office Visit Note  Patient: Tasha [name]             Date of Birth: 03-07-1965           MRN: [id number]             PCP: Glenda Chroman, MD Referring: Glenda Chroman, MD Visit Date: 06/09/2016 Occupation: @GUAROCC @    Subjective:  Follow up   History of Present Illness: Tasha [name] is a 51 y.o. female with history of him autoimmune disease and TIA she returns for follow-up visit today. During our last discussion in November 2017 we decided to take her off the Plaquenil. She's been off Plaquenil since then. She has not noticed any difference in her symptoms. No new symptoms were noted. She is still on Coumadin for history of TIA . She's not having much discomfort in her C-spine. Recently she's been having some discomfort in her right hand.   Activities of Daily Living:  Patient reports morning stiffness for all day  minutes.   Patient Denies nocturnal pain.  Difficulty dressing/grooming: Denies Difficulty climbing stairs: Reports Difficulty getting out of chair: Denies Difficulty using hands for taps, buttons, cutlery, and/or writing: Denies   Review of Systems  Constitutional: Negative for night sweats, weight gain, weight loss and weakness.  HENT: Negative for mouth sores, trouble swallowing, trouble swallowing, mouth dryness and nose dryness.   Eyes: Negative for pain, redness, visual disturbance and dryness.  Respiratory: Negative for cough, shortness of breath and difficulty breathing.   Cardiovascular: Negative for chest pain, palpitations, hypertension, irregular heartbeat and swelling in legs/feet.  Gastrointestinal: Negative for blood in stool, constipation and diarrhea.  Endocrine: Negative for increased urination.  Genitourinary: Negative for vaginal dryness.  Musculoskeletal: Positive for arthralgias, joint pain and morning stiffness. Negative for joint swelling, myalgias, muscle weakness, muscle tenderness and myalgias.  Skin: Negative for color change, rash, hair  loss, skin tightness, ulcers and sensitivity to sunlight.  Allergic/Immunologic: Negative for susceptible to infections.  Neurological: Negative for dizziness, memory loss and night sweats.  Hematological: Negative for swollen glands.  Psychiatric/Behavioral: Negative for depressed mood and sleep disturbance. The patient is not nervous/anxious.     PMFS History:  Patient Active Problem List   Diagnosis Date Noted  . Renal cyst 01/06/2016  . Autoimmune disease  01/04/2016  . High risk medication use 01/04/2016  . Chronic kidney disease (CKD) stage G4 01/04/2016  . DJD (degenerative joint disease), cervical 01/04/2016  . History of TIA (transient ischemic attack) 01/04/2016  . Chronic bronchitis ([place]) 01/04/2016  . Essential hypertension 01/04/2016  . Dyslipidemia 01/04/2016  . Glaucoma 01/04/2016  . Graves disease 01/04/2016  . MIXED HYPERLIPIDEMIA 11/18/2009  . Elevated LFTs 11/18/2009  . AORTIC VALVE DISORDERS 05/24/2009  . CAROTID ARTERY DISEASE 05/24/2009    Past Medical History:  Diagnosis Date  . Anemia   . Aortic insufficiency    Mild to moderate  . Asthma   . Asthma   . Breast nodule   . Cardiac arrhythmia   . Carotid artery occlusion    Carotid artery disease  . 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 COPD (chronic obstructive pulmonary disease) ([place])   . CRI (chronic renal insufficiency)   . Folic acid deficiency   . Glaucoma   . Graves disease   . Hyperlipidemia   . Hypertension   . Iron deficiency   . Recurrent UTI   . Renal cyst   . Systemic vasculitis (  [place])    Not otherwise specified, Dr. Sonny Dandy    History reviewed. No pertinent family history. Past Surgical History:  Procedure Laterality Date  . ORIF ORBITAL FRACTURE  1996   Left  . SPINAL FUSION  1994   At C3-5   Social History   Social History Narrative   No regular exercise.     Objective: Vital Signs: BP (!) 144/68   Pulse 66   Resp 12   Ht 5' 1"   (1.549 m)   Wt 96 lb (43.5 kg)   BMI 18.14 kg/m    Physical Exam  Constitutional: She is oriented to person, place, and time. She appears well-developed and well-nourished.  HENT:  Head: Normocephalic and atraumatic.  Eyes: Conjunctivae and EOM are normal.  Neck: Normal range of motion.  Cardiovascular: Normal rate, regular rhythm, normal heart sounds and intact distal pulses.   Pulmonary/Chest: Effort normal and breath sounds normal.  Abdominal: Soft. Bowel sounds are normal.  Lymphadenopathy:    She has no cervical adenopathy.  Neurological: She is alert and oriented to person, place, and time.  Skin: Skin is warm and dry. Capillary refill takes less than 2 seconds.  Psychiatric: She has a normal mood and affect. Her behavior is normal.  Nursing note and vitals reviewed.    Musculoskeletal Exam: C-spine and thoracic lumbar spine good range of motion. Shoulder joints elbow joints wrist joint MCPs PIPs DIPs with good range of motion. She has mild tenderness over right second PIP joint with no synovitis. Hip joints knee joints ankles MTPs PIPs DIPs with good range of motion with no synovitis.  CDAI Exam: No CDAI exam completed.    Investigation: No additional findings.  No visits with results within 2 Month(s) from this visit.  Latest known visit with results is:  Office Visit on 01/07/2016  Component Date Value Ref Range Status  . Sodium 01/07/2016 129* 135 - 146 mmol/L Final  . Potassium 01/07/2016 6.0* 3.5 - 5.3 mmol/L Final   Comment: No visible hemolysis. Patient serum had prolonged contact with red cells. Integrity of results may be affected.   . Chloride 01/07/2016 94* 98 - 110 mmol/L Final  . CO2 01/07/2016 22  20 - 31 mmol/L Final  . Glucose, Bld 01/07/2016 98  65 - 99 mg/dL Final   Comment: Patient serum had prolonged contact with red cells. Integrity of results may be affected.   . BUN 01/07/2016 41* 7 - 25 mg/dL Final  . Creat 01/07/2016 3.30* 0.50 - 1.05  mg/dL Final   Comment:   For patients > or = 53 years of age: The upper reference limit for Creatinine is approximately 13% higher for people identified as African-American.     . Total Bilirubin 01/07/2016 0.4  0.2 - 1.2 mg/dL Final  . Alkaline Phosphatase 01/07/2016 58  33 - 130 U/L Final  . AST 01/07/2016 46* 10 - 35 U/L Final  . ALT 01/07/2016 28  6 - 29 U/L Final  . Total Protein 01/07/2016 6.3  6.1 - 8.1 g/dL Final  . Albumin 01/07/2016 3.7  3.6 - 5.1 g/dL Final  . Calcium 01/07/2016 9.2  8.6 - 10.4 mg/dL Final  . GFR, Est African American 01/07/2016 18* >=60 mL/min Final  . GFR, Est Non African American 01/07/2016 15* >=60 mL/min Final  . WBC 01/07/2016 5.8  3.8 - 10.8 K/uL Final  . RBC 01/07/2016 2.86* 3.80 - 5.10 MIL/uL Final  . Hemoglobin 01/07/2016 9.9* 11.7 - 15.5 g/dL Final  .  HCT 01/07/2016 29.2* 35.0 - 45.0 % Final  . MCV 01/07/2016 102.1* 80.0 - 100.0 fL Final  . MCH 01/07/2016 34.6* 27.0 - 33.0 pg Final  . MCHC 01/07/2016 33.9  32.0 - 36.0 g/dL Final  . RDW 01/07/2016 14.0  11.0 - 15.0 % Final  . Platelets 01/07/2016 383  140 - 400 K/uL Final  . MPV 01/07/2016 9.9  7.5 - 12.5 fL Final  . Neutro Abs 01/07/2016 3770  1,500 - 7,800 cells/uL Final  . Lymphs Abs 01/07/2016 1450  850 - 3,900 cells/uL Final  . Monocytes Absolute 01/07/2016 522  200 - 950 cells/uL Final  . Eosinophils Absolute 01/07/2016 58  15 - 500 cells/uL Final  . Basophils Absolute 01/07/2016 0  0 - 200 cells/uL Final  . Neutrophils Relative % 01/07/2016 65  % Final  . Lymphocytes Relative 01/07/2016 25  % Final  . Monocytes Relative 01/07/2016 9  % Final  . Eosinophils Relative 01/07/2016 1  % Final  . Basophils Relative 01/07/2016 0  % Final  . Smear Review 01/07/2016 Criteria for review not met   Final    Imaging: No results found.  Speciality Comments: No specialty comments available.    Procedures:  No procedures performed Allergies: Sulfonamide derivatives   Assessment / Plan:       Visit Diagnoses: Autoimmune disease  - History of positive ANA, positive RNP, positive beta-2, elevated ESR later labs became negative -She has been off Plaquenil for almost 6 months now. She has not had any new symptoms. I'll check following labs to monitor for disease process. Plan: CBC with Differential/Platelet, COMPLETE METABOLIC PANEL WITH GFR, Urinalysis, Routine w reflex microscopic, Sedimentation rate, C3 and C4, Anti-DNA antibody, double-stranded, RNP Antibody, ANA, Beta-2 glycoprotein antibodies. I will contact patient once the labs are available. She supposed to notify me if she develops any new symptoms.  High risk medication use - Plaquenil discontinued November 2017  Elevated LFTs: In the past  History of TIA (transient ischemic attack) - On Coumadin  DJD (degenerative joint disease), cervical: She is minimal discomfort  Chronic kidney disease (CKD) stage G4: Followed by Dr. Nilda Calamity  Mixed hyperlipidemia  Essential hypertension  Dyslipidemia  Glaucoma, unspecified glaucoma type, unspecified laterality  Graves disease  COPD, former smoker   She also has Mixed hyperlipidemia; Aortic Valve Disorder; Carotic Artery Disease; History of TIA (transient ischemic attack); Chronic bronchitis ([place]); Essential hypertension; Dyslipidemia; Glaucoma; Graves disease; and Renal cyst on her problem list.  Orders :  Orders Placed This Encounter  Procedures  . CBC with Differential/Platelet  . COMPLETE METABOLIC PANEL WITH GFR  . Urinalysis, Routine w reflex microscopic  . Sedimentation rate  . C3 and C4  . Anti-DNA antibody, double-stranded  . RNP Antibody  . ANA  . Beta-2 glycoprotein antibodies   No orders of the defined types were placed in this encounter.   Face-to-face time spent with patient was 30 minutes. 50% of time was spent in counseling and coordination of care.  Follow-Up Instructions: Return for Autoimmune disease.   Bo Merino, MD  Note - This record  has been created using Editor, commissioning.  Chart creation errors have been sought, but may not always  have been located. Such creation errors do not reflect on  the standard of medical care.

## 2016-06-09 ENCOUNTER — Ambulatory Visit (INDEPENDENT_AMBULATORY_CARE_PROVIDER_SITE_OTHER): Payer: 59 | Admitting: Rheumatology

## 2016-06-09 ENCOUNTER — Encounter: Payer: Self-pay | Admitting: Rheumatology

## 2016-06-09 VITALS — BP 144/68 | HR 66 | Resp 12 | Ht 61.0 in | Wt 96.0 lb

## 2016-06-09 DIAGNOSIS — Z79899 Other long term (current) drug therapy: Secondary | ICD-10-CM | POA: Diagnosis not present

## 2016-06-09 DIAGNOSIS — M47812 Spondylosis without myelopathy or radiculopathy, cervical region: Secondary | ICD-10-CM

## 2016-06-09 DIAGNOSIS — R945 Abnormal results of liver function studies: Secondary | ICD-10-CM

## 2016-06-09 DIAGNOSIS — N184 Chronic kidney disease, stage 4 (severe): Secondary | ICD-10-CM

## 2016-06-09 DIAGNOSIS — H409 Unspecified glaucoma: Secondary | ICD-10-CM | POA: Diagnosis not present

## 2016-06-09 DIAGNOSIS — D8989 Other specified disorders involving the immune mechanism, not elsewhere classified: Secondary | ICD-10-CM | POA: Diagnosis not present

## 2016-06-09 DIAGNOSIS — E782 Mixed hyperlipidemia: Secondary | ICD-10-CM | POA: Diagnosis not present

## 2016-06-09 DIAGNOSIS — I10 Essential (primary) hypertension: Secondary | ICD-10-CM

## 2016-06-09 DIAGNOSIS — R7989 Other specified abnormal findings of blood chemistry: Secondary | ICD-10-CM | POA: Diagnosis not present

## 2016-06-09 DIAGNOSIS — Z8673 Personal history of transient ischemic attack (TIA), and cerebral infarction without residual deficits: Secondary | ICD-10-CM | POA: Diagnosis not present

## 2016-06-09 DIAGNOSIS — M359 Systemic involvement of connective tissue, unspecified: Secondary | ICD-10-CM

## 2016-06-09 DIAGNOSIS — J42 Unspecified chronic bronchitis: Secondary | ICD-10-CM

## 2016-06-09 DIAGNOSIS — E0500 Thyrotoxicosis with diffuse goiter without thyrotoxic crisis or storm: Secondary | ICD-10-CM

## 2016-06-09 DIAGNOSIS — M5030 Other cervical disc degeneration, unspecified cervical region: Secondary | ICD-10-CM | POA: Diagnosis not present

## 2016-06-09 DIAGNOSIS — E785 Hyperlipidemia, unspecified: Secondary | ICD-10-CM

## 2016-06-09 LAB — CBC WITH DIFFERENTIAL/PLATELET
BASOS ABS: 0 {cells}/uL (ref 0–200)
Basophils Relative: 0 %
EOS PCT: 1 %
Eosinophils Absolute: 73 cells/uL (ref 15–500)
HCT: 29.9 % — ABNORMAL LOW (ref 35.0–45.0)
HEMOGLOBIN: 10.3 g/dL — AB (ref 11.7–15.5)
LYMPHS ABS: 1679 {cells}/uL (ref 850–3900)
Lymphocytes Relative: 23 %
MCH: 34.2 pg — AB (ref 27.0–33.0)
MCHC: 34.4 g/dL (ref 32.0–36.0)
MCV: 99.3 fL (ref 80.0–100.0)
MONOS PCT: 8 %
MPV: 9.8 fL (ref 7.5–12.5)
Monocytes Absolute: 584 cells/uL (ref 200–950)
NEUTROS PCT: 68 %
Neutro Abs: 4964 cells/uL (ref 1500–7800)
PLATELETS: 378 10*3/uL (ref 140–400)
RBC: 3.01 MIL/uL — AB (ref 3.80–5.10)
RDW: 14.7 % (ref 11.0–15.0)
WBC: 7.3 10*3/uL (ref 3.8–10.8)

## 2016-06-09 LAB — COMPLETE METABOLIC PANEL WITH GFR
ALK PHOS: 73 U/L (ref 33–130)
ALT: 9 U/L (ref 6–29)
AST: 24 U/L (ref 10–35)
Albumin: 4 g/dL (ref 3.6–5.1)
BUN: 43 mg/dL — AB (ref 7–25)
CHLORIDE: 90 mmol/L — AB (ref 98–110)
CO2: 22 mmol/L (ref 20–31)
Calcium: 9.4 mg/dL (ref 8.6–10.4)
Creat: 3.56 mg/dL — ABNORMAL HIGH (ref 0.50–1.05)
GFR, EST NON AFRICAN AMERICAN: 14 mL/min — AB (ref >=60)
GFR, Est African American: 16 mL/min — ABNORMAL LOW (ref >=60)
GLUCOSE: 96 mg/dL (ref 65–99)
POTASSIUM: 4.7 mmol/L (ref 3.5–5.3)
SODIUM: 126 mmol/L — AB (ref 135–146)
Total Bilirubin: 0.4 mg/dL (ref 0.2–1.2)
Total Protein: 6.8 g/dL (ref 6.1–8.1)

## 2016-06-10 LAB — BETA-2 GLYCOPROTEIN ANTIBODIES
Beta-2-Glycoprotein I IgA: <9 SAU (ref <=20)
Beta-2-Glycoprotein I IgM: <9 SMU (ref <=20)

## 2016-06-10 LAB — URINALYSIS, ROUTINE W REFLEX MICROSCOPIC
BILIRUBIN URINE: NEGATIVE
GLUCOSE, UA: NEGATIVE
Ketones, ur: NEGATIVE
LEUKOCYTES UA: NEGATIVE
Nitrite: NEGATIVE
PH: 7 (ref 5.0–8.0)
SPECIFIC GRAVITY, URINE: 1.006 (ref 1.001–1.035)

## 2016-06-10 LAB — URINALYSIS, MICROSCOPIC ONLY
BACTERIA UA: NONE SEEN [HPF]
CRYSTALS: NONE SEEN [HPF]
Casts: NONE SEEN [LPF]
Yeast: NONE SEEN [HPF]

## 2016-06-10 LAB — C3 AND C4
C3 Complement: 108 mg/dL (ref 83–193)
C4 COMPLEMENT: 30 mg/dL (ref 15–57)

## 2016-06-10 LAB — ANTI-DNA ANTIBODY, DOUBLE-STRANDED: DS DNA AB: 2 [IU]/mL

## 2016-06-10 LAB — RNP ANTIBODY: RIBONUCLEIC PROTEIN(ENA) ANTIBODY, IGG: NEGATIVE

## 2016-06-10 LAB — SEDIMENTATION RATE: Sed Rate: 40 mm/hr — ABNORMAL HIGH (ref 0–30)

## 2016-06-10 LAB — ANA: Anti Nuclear Antibody(ANA): NEGATIVE

## 2016-06-11 NOTE — Progress Notes (Signed)
All autoimmune labs are normal. ESR elevated. Please notify pt and fax to her PCP.

## 2016-06-29 ENCOUNTER — Telehealth: Payer: Self-pay | Admitting: Radiology

## 2016-06-29 NOTE — Telephone Encounter (Signed)
I had a voicemail from Dr Nilda Calamity to send labs, but phone cut out on fax number, there was no phone number given on voicemail. I did send the labs to her PCP Dr Woody Seller.   I called pt to see if she could tell me where they are to go, she states Dr Nilda Calamity is the Nephrologist, I have faxed.

## 2016-11-18 ENCOUNTER — Encounter: Payer: Self-pay | Admitting: Cardiology

## 2016-11-18 ENCOUNTER — Ambulatory Visit (INDEPENDENT_AMBULATORY_CARE_PROVIDER_SITE_OTHER): Payer: Managed Care, Other (non HMO) | Admitting: Cardiology

## 2016-11-18 ENCOUNTER — Telehealth: Payer: Self-pay | Admitting: Cardiology

## 2016-11-18 VITALS — BP 142/68 | HR 75 | Ht 61.0 in | Wt 94.4 lb

## 2016-11-18 DIAGNOSIS — I779 Disorder of arteries and arterioles, unspecified: Secondary | ICD-10-CM | POA: Diagnosis not present

## 2016-11-18 DIAGNOSIS — N184 Chronic kidney disease, stage 4 (severe): Secondary | ICD-10-CM | POA: Diagnosis not present

## 2016-11-18 DIAGNOSIS — I351 Nonrheumatic aortic (valve) insufficiency: Secondary | ICD-10-CM | POA: Diagnosis not present

## 2016-11-18 DIAGNOSIS — I10 Essential (primary) hypertension: Secondary | ICD-10-CM

## 2016-11-18 DIAGNOSIS — I739 Peripheral vascular disease, unspecified: Secondary | ICD-10-CM

## 2016-11-18 DIAGNOSIS — Z01818 Encounter for other preprocedural examination: Secondary | ICD-10-CM | POA: Diagnosis not present

## 2016-11-18 NOTE — Telephone Encounter (Signed)
Pre-cert Verification for the following procedure   Cartoid Dopplers - scheduled for 11-26-16 Lexiscan scheduled for 11-27-16

## 2016-11-18 NOTE — Progress Notes (Signed)
Cardiology Office Note  Date: 11/18/2016   ID: Tasha [name], DOB February 09, 1966, MRN [id number]  PCP: Tasha Chroman, MD  Consulting Cardiologist: Tasha Lesches, MD   Chief Complaint  Patient presents with  . Aortic regurgitation    History of Present Illness: Tasha [name] is a 51 y.o. female referred for cardiology consultation by Tasha [name] for evaluation of aortic regurgitation. I have seen her in the past, last evaluated in 2011 with a known history of mild to moderate aortic regurgitation at that time. Past medical history is outlined below, she has had progressive renal failure over the years, currently CKD stage 4, following regularly with nephrology and has not had to undergo hemodialysis. She has had recent evaluation for renal transplant list at [facility] [place]. She has been turned down for listing citing concerns about her cardiac status, specifically aortic regurgitation.  From a functional perspective, she does not report any exertional chest pain, has NYHA class II with typical activities. She has had no palpitations or syncope. No orthopnea or PND. No leg swelling.  She had a recent follow-up echocardiogram done at [facility] in August as summarized below. Report indicates normal LVEF without significant ventricular dilatation (LVIDd 44 mm), moderate aortic regurgitation with otherwise sclerotic aortic valve.  I reviewed her medications which are outlined below. She remains on Coumadin with prior history of stroke, follows with Tasha [name]. Also on Norvasc and Lopressor for treatment of hypertension.  Past Medical History:  Diagnosis Date  . Anemia   . Aortic insufficiency   . Asthma   . Breast nodule   . Carotid artery disease ([place])   . 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 CKD (chronic kidney disease) stage 4, GFR 15-29 ml/min (HCC)   . COPD (chronic obstructive pulmonary disease) ([place])   . CRI (chronic renal insufficiency)   . Folic acid  deficiency   . Glaucoma   . Graves disease   . Hyperlipidemia   . Hypertension   . Iron deficiency   . Recurrent UTI   . Renal cyst   . Systemic vasculitis ([place])    Not otherwise specified, Dr. Sonny [name]    Past Surgical History:  Procedure Laterality Date  . ORIF ORBITAL FRACTURE  1996   Left  . SPINAL FUSION  1994   At C3-5    Current Outpatient Prescriptions  Medication Sig Dispense Refill  . albuterol (PROAIR HFA) 108 (90 BASE) MCG/ACT inhaler Inhale into the lungs as needed.      Marland Kitchen amLODipine (NORVASC) 10 MG tablet Take 10 mg by mouth daily.    . calcitRIOL (ROCALTROL) 0.25 MCG capsule Take 1 capsule by mouth daily.    . ferrous sulfate 325 (65 FE) MG tablet Take 325 mg by mouth daily.      . folic acid (FOLVITE) 1 MG tablet Take 1 mg by mouth daily.      . metoprolol tartrate (LOPRESSOR) 25 MG tablet Take 25 mg by mouth 2 (two) times daily.     . sodium bicarbonate 650 MG tablet Take 650 mg by mouth 2 (two) times daily.     . Travoprost (TRAVATAN OP) Apply 1 drop to eye at bedtime. Right eye    . warfarin (COUMADIN) 5 MG tablet Take 5 mg by mouth daily. MANAGED BY PMD    . WELCHOL 3.75 g PACK Take 1 packet by mouth daily.     No current facility-administered medications for this visit.    Allergies:  Sulfonamide derivatives   Social History: The patient  reports that she quit smoking about 8 years ago. She has never used smokeless tobacco. She reports that she drinks alcohol. She reports that she does not use drugs.   Family History: The patient's family history is not on file.   ROS:  Please see the history of present illness. Otherwise, complete review of systems is positive for reports recent hospitalization with acute on chronic renal failure, improved with IV fluids. No fevers or chills. All other systems are reviewed and negative.   Physical Exam: VS:  BP (!) 142/68   Pulse 75   Ht 5\' 1"  (1.549 m)   Wt 94 lb 6.4 oz (42.8 kg)   SpO2 100%   BMI 17.84 kg/m , BMI  Body mass index is 17.84 kg/m.  Wt Readings from Last 3 Encounters:  11/18/16 94 lb 6.4 oz (42.8 kg)  06/09/16 96 lb (43.5 kg)  01/07/16 94 lb (42.6 kg)    General: Petite woman, no acute distress. HEENT: Conjunctiva and lids normal, oropharynx clear. Neck: Supple, no elevated JVP, probable radiation of systolic murmur to the right neck, no thyromegaly. Lungs: Clear to auscultation, nonlabored breathing at rest. Cardiac: Regular rate and rhythm, no S3, 2/6 systolic murmur, no loud diastolic murmur, no pericardial rub. Abdomen: Soft, nontender, bowel sounds present, no guarding or rebound. Extremities: No pitting edema, distal pulses 2+. Skin: Warm and dry. Musculoskeletal: No kyphosis. Neuropsychiatric: Alert and oriented x3, affect grossly appropriate.  ECG: I personally reviewed the tracing from 05/24/2009 which showed sinus bradycardia.  Recent Labwork: 06/09/2016: ALT 9; AST 24; BUN 43; Creat 3.56; Hemoglobin 10.3; Platelets 378; Potassium 4.7; Sodium 126  April 2016: Cholesterol 252, LDL 120, triglycerides 98, HDL 112  Other Studies Reviewed Today:  Echocardiogram 10/06/2016 [facility]): Normal LV wall thickness with LVEF 55-60%, prolonged diastolic relaxation, normal right ventricular contraction, mild left atrial enlargement, mildly thickened aortic valve with moderate aortic regurgitation, mildly thickened mitral valve with mild mitral annular calcification and trace mitral regurgitation, trace tricuspid regurgitation, unable to calculate RVSP, no pericardial effusion.  Carotid Dopplers 11/22/2009: Mild bilateral ICA stenoses less than 50%.  Assessment and Plan:  1. Cardiac evaluation in the setting of consideration for renal transplant listing. Patient was declined listing by [facility] due to aortic valve disease. I am not entirely certain why asymptomatic, moderate aortic regurgitation without evidence of LV dilatation or dysfunction would necessarily represent a contraindication  for renal transplantation. She does have a history of atherosclerosis in the form of previously documented carotid artery disease, and certainly an ischemic evaluation would be in order. We will obtain a Lexiscan Myoview to assess ischemic burden. Otherwise, she does need to have regular follow-up for her aortic regurgitation, a one-year visit will be scheduled tentatively.  2. History of carotid artery disease, not assessed in several years. Last Dopplers that I have for review were from 2011 demonstrating less than 50% bilateral ICA stenoses. I am not certain whether this has been followed up by other providers in the interim. We will obtain carotid Dopplers.  3. CAD stage 4, followed by Tasha [name].  4. Essential hypertension, on Norvasc and Lopressor. She follows with Tasha [name].  Current medicines were reviewed with the patient today.   Orders Placed This Encounter  Procedures  . NM Myocar Multi W/Spect W/Wall Motion / EF  . US Carotid Duplex Bilateral    Disposition: Follow-up in one year.  Signed, Satira Sark, MD, [facility] 11/18/2016 3:19 PM  [facility] [place], [place], [place] [address] Phone: [phone]; Fax: [phone]

## 2016-11-18 NOTE — Patient Instructions (Signed)
Medication Instructions:  Your physician recommends that you continue on your current medications as directed. Please refer to the Current Medication list given to you today.  Labwork: NONE  Testing/Procedures: Your physician has requested that you have a lexiscan myoview. For further information please visit [email or web address]. Please follow instruction sheet, as given.  Your physician has requested that you have a carotid duplex. This test is an ultrasound of the carotid arteries in your neck. It looks at blood flow through these arteries that supply the brain with blood. Allow one hour for this exam. There are no restrictions or special instructions.  Follow-Up: Your physician wants you to follow-up in: [place]. You will receive a reminder letter in the mail two months in advance. If you don't receive a letter, please call our office to schedule the follow-up appointment.  Any Other Special Instructions Will Be Listed Below (If Applicable).  If you need a refill on your cardiac medications before your next appointment, please call your pharmacy.

## 2016-11-18 NOTE — Addendum Note (Signed)
Addended by: Julian Hy T on: 11/18/2016 03:27 PM   Modules accepted: Orders

## 2016-11-26 ENCOUNTER — Ambulatory Visit (INDEPENDENT_AMBULATORY_CARE_PROVIDER_SITE_OTHER): Payer: Managed Care, Other (non HMO)

## 2016-11-26 DIAGNOSIS — I779 Disorder of arteries and arterioles, unspecified: Secondary | ICD-10-CM

## 2016-11-26 DIAGNOSIS — I739 Peripheral vascular disease, unspecified: Principal | ICD-10-CM

## 2016-11-27 ENCOUNTER — Ambulatory Visit (HOSPITAL_BASED_OUTPATIENT_CLINIC_OR_DEPARTMENT_OTHER)
Admission: RE | Admit: 2016-11-27 | Discharge: 2016-11-27 | Disposition: A | Discharge status: Home / Self Care | Payer: Managed Care, Other (non HMO) | Source: Ambulatory Visit | Attending: Cardiology | Admitting: Cardiology

## 2016-11-27 ENCOUNTER — Encounter (HOSPITAL_COMMUNITY): Payer: Self-pay

## 2016-11-27 ENCOUNTER — Ambulatory Visit (HOSPITAL_COMMUNITY)
Admission: RE | Admit: 2016-11-27 | Discharge: 2016-11-27 | Disposition: A | Discharge status: Home / Self Care | Payer: Managed Care, Other (non HMO) | Source: Ambulatory Visit | Attending: Internal Medicine | Admitting: Internal Medicine

## 2016-11-27 DIAGNOSIS — Z01818 Encounter for other preprocedural examination: Secondary | ICD-10-CM | POA: Insufficient documentation

## 2016-11-27 DIAGNOSIS — I351 Nonrheumatic aortic (valve) insufficiency: Secondary | ICD-10-CM | POA: Diagnosis not present

## 2016-11-27 DIAGNOSIS — I779 Disorder of arteries and arterioles, unspecified: Secondary | ICD-10-CM | POA: Diagnosis not present

## 2016-11-27 LAB — NM MYOCAR MULTI W/SPECT W/WALL MOTION / EF
CHL CUP NUCLEAR SSS: 11
CSEPPHR: 106 {beats}/min
LV sys vol: 59 mL
LVDIAVOL: 112 mL (ref 46–106)
NUC STRESS TID: 1.17
RATE: 0.4
Rest HR: 61 {beats}/min
SDS: 6
SRS: 5

## 2016-11-27 MED ORDER — SODIUM CHLORIDE 0.9% FLUSH
INTRAVENOUS | Status: AC
  Administered 2016-11-27: 10 mL via INTRAVENOUS
  Filled 2016-11-27: qty 10

## 2016-11-27 MED ORDER — TECHNETIUM TC 99M TETROFOSMIN IV KIT
30.0000 | PACK | Freq: Once | INTRAVENOUS | Status: AC | PRN
  Administered 2016-11-27: 30 via INTRAVENOUS

## 2016-11-27 MED ORDER — REGADENOSON 0.4 MG/5ML IV SOLN
INTRAVENOUS | Status: AC
  Administered 2016-11-27: 0.4 mg via INTRAVENOUS
  Filled 2016-11-27: qty 5

## 2016-11-27 MED ORDER — TECHNETIUM TC 99M TETROFOSMIN IV KIT
10.0000 | PACK | Freq: Once | INTRAVENOUS | Status: AC | PRN
  Administered 2016-11-27: 10.8 via INTRAVENOUS

## 2016-11-30 ENCOUNTER — Telehealth: Payer: Self-pay

## 2016-11-30 NOTE — Telephone Encounter (Signed)
-----   Message from Acquanetta Chain, LPN sent at 65/05/6501  9:25 AM EDT -----   ----- Message ----- From: Satira Sark, MD Sent: 11/27/2016   2:46 PM To: Merlene Laughter, LPN  Results reviewed. Please let her know that the stress test was reassuring, no ischemic defects to suggest underlying obstructive CAD. Continue with her current follow-up plan. A copy of this test should be forwarded to Glenda Chroman, MD.

## 2016-11-30 NOTE — Progress Notes (Deleted)
Office Visit Note  Patient: Tasha [name]             Date of Birth: April 17, 1965           MRN: [id number]             PCP: Glenda Chroman, MD Referring: Glenda Chroman, MD Visit Date: 12/10/2016 Occupation: @GUAROCC @    Subjective:  No chief complaint on file.   History of Present Illness: Tasha [name] is a 51 y.o. female ***   Activities of Daily Living:  Patient reports morning stiffness for *** {minute/hour:19697}.   Patient {ACTIONS;DENIES/REPORTS:21021675::"Denies"} nocturnal pain.  Difficulty dressing/grooming: {ACTIONS;DENIES/REPORTS:21021675::"Denies"} Difficulty climbing stairs: {ACTIONS;DENIES/REPORTS:21021675::"Denies"} Difficulty getting out of chair: {ACTIONS;DENIES/REPORTS:21021675::"Denies"} Difficulty using hands for taps, buttons, cutlery, and/or writing: {ACTIONS;DENIES/REPORTS:21021675::"Denies"}   No Rheumatology ROS completed.   PMFS History:  Patient Active Problem List   Diagnosis Date Noted  . Renal cyst 01/06/2016  . Autoimmune disease  01/04/2016  . High risk medication use 01/04/2016  . Chronic kidney disease (CKD) stage G4 01/04/2016  . DJD (degenerative joint disease), cervical 01/04/2016  . History of TIA (transient ischemic attack) 01/04/2016  . Chronic bronchitis ([place]) 01/04/2016  . Essential hypertension 01/04/2016  . Dyslipidemia 01/04/2016  . Glaucoma 01/04/2016  . Jianni Shelden disease 01/04/2016  . MIXED HYPERLIPIDEMIA 11/18/2009  . Elevated LFTs 11/18/2009  . AORTIC VALVE DISORDERS 05/24/2009  . CAROTID ARTERY DISEASE 05/24/2009    Past Medical History:  Diagnosis Date  . Anemia   . Aortic insufficiency   . Asthma   . Breast nodule   . Carotid artery disease ([place])   . 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 CKD (chronic kidney disease) stage 4, GFR 15-29 ml/min ([facility])   . COPD (chronic obstructive pulmonary disease) ([place])   . CRI (chronic renal insufficiency)   . Folic acid deficiency   .  Glaucoma   . [place] disease   . Hyperlipidemia   . Hypertension   . Iron deficiency   . Recurrent UTI   . Renal cyst   . Systemic vasculitis ([place])    Not otherwise specified, Dr. Sonny Dandy    No family history on file. Past Surgical History:  Procedure Laterality Date  . ORIF ORBITAL FRACTURE  1996   Left  . SPINAL FUSION  1994   At C3-5   Social History   Social History Narrative   No regular exercise.     Objective: Vital Signs: There were no vitals taken for this visit.   Physical Exam   Musculoskeletal Exam: ***  CDAI Exam: No CDAI exam completed.    Investigation: No additional findings. CBC Latest Ref Rng & Units 06/09/2016 01/07/2016 10/19/2015  WBC 3.8 - 10.8 K/uL 7.3 5.8 7.1  Hemoglobin 11.7 - 15.5 g/dL 10.3(L) 9.9(L) 10.0(A)  Hematocrit 35.0 - 45.0 % 29.9(L) 29.2(L) 29(A)  Platelets 140 - 400 K/uL 378 383 335   CMP Latest Ref Rng & Units 06/09/2016 01/07/2016 10/19/2015  Glucose 65 - 99 mg/dL 96 98 -  BUN 7 - 25 mg/dL 43(H) 41(H) 26(A)  Creatinine 0.50 - 1.05 mg/dL 3.56(H) 3.30(H) 2.2(A)  Sodium 135 - 146 mmol/L 126(L) 129(L) 125(A)  Potassium 3.5 - 5.3 mmol/L 4.7 6.0(H) 4.8  Chloride 98 - 110 mmol/L 90(L) 94(L) -  CO2 20 - 31 mmol/L 22 22 -  Calcium 8.6 - 10.4 mg/dL 9.4 9.2 -  Total Protein 6.1 - 8.1 g/dL 6.8 6.3 -  Total Bilirubin 0.2 -  1.2 mg/dL 0.4 0.4 -  Alkaline Phos 33 - 130 U/L 73 58 57  AST 10 - 35 U/L 24 46(H) 30  ALT 6 - 29 U/L 9 28 12     Imaging: Nm Myocar Multi W/spect W/wall Motion / Ef  Result Date: 11/27/2016  Normal perfusion No ischemia  LVEF 47%, by visual estimate appears greater Consider echo to further define  This is a low risk study.     Speciality Comments: No specialty comments available.    Procedures:  No procedures performed Allergies: Sulfonamide derivatives   Assessment / Plan:     Visit Diagnoses: No diagnosis found.    Orders: No orders of the defined types were placed in this encounter.  No orders  of the defined types were placed in this encounter.   Face-to-face time spent with patient was *** minutes. 50% of time was spent in counseling and coordination of care.  Follow-Up Instructions: No Follow-up on file.   Earnestine Mealing, NT  Note - This record has been created using Editor, commissioning.  Chart creation errors have been sought, but may not always  have been located. Such creation errors do not reflect on  the standard of medical care.

## 2016-11-30 NOTE — Telephone Encounter (Signed)
Patient notified. Routed to PCP 

## 2016-12-01 ENCOUNTER — Telehealth: Payer: Self-pay

## 2016-12-01 LAB — VAS US CAROTID
LCCADSYS: 105 cm/s
LEFT ECA DIAS: -15 cm/s
LEFT VERTEBRAL DIAS: 27 cm/s
Left CCA dist dias: 24 cm/s
Left CCA prox dias: 33 cm/s
Left CCA prox sys: 203 cm/s
Left ICA dist dias: -42 cm/s
Left ICA dist sys: -141 cm/s
Left ICA prox dias: -33 cm/s
Left ICA prox sys: -137 cm/s
RCCAPDIAS: 36 cm/s
RIGHT ECA DIAS: 15 cm/s
Right CCA prox sys: 148 cm/s
Right cca dist sys: -141 cm/s

## 2016-12-01 NOTE — Telephone Encounter (Signed)
-----   Message from Acquanetta Chain, LPN sent at 70/08/6149  2:38 PM EDT -----   ----- Message ----- From: Satira Sark, MD Sent: 12/01/2016   1:14 PM To: Merlene Laughter, LPN  Results reviewed. Carotid Dopplers demonstrate stable atherosclerosis, less than 40% bilateral ICA stenoses. A copy of this test should be forwarded to Glenda Chroman, MD.

## 2016-12-01 NOTE — Telephone Encounter (Signed)
Patient notified. Routed to PCP 

## 2016-12-10 ENCOUNTER — Ambulatory Visit: Payer: 59 | Admitting: Rheumatology

## 2017-06-30 ENCOUNTER — Other Ambulatory Visit: Payer: Self-pay

## 2017-06-30 ENCOUNTER — Inpatient Hospital Stay (HOSPITAL_COMMUNITY): Payer: Managed Care, Other (non HMO) | Attending: Hematology | Admitting: Hematology

## 2017-06-30 ENCOUNTER — Encounter (HOSPITAL_COMMUNITY): Payer: Self-pay | Admitting: Hematology

## 2017-06-30 VITALS — BP 141/64 | HR 68 | Temp 98.3°F | Resp 16 | Ht 61.0 in | Wt 86.4 lb

## 2017-06-30 DIAGNOSIS — N185 Chronic kidney disease, stage 5: Secondary | ICD-10-CM | POA: Insufficient documentation

## 2017-06-30 DIAGNOSIS — R590 Localized enlarged lymph nodes: Secondary | ICD-10-CM

## 2017-06-30 DIAGNOSIS — C3401 Malignant neoplasm of right main bronchus: Secondary | ICD-10-CM | POA: Diagnosis not present

## 2017-06-30 DIAGNOSIS — Z8673 Personal history of transient ischemic attack (TIA), and cerebral infarction without residual deficits: Secondary | ICD-10-CM | POA: Insufficient documentation

## 2017-06-30 DIAGNOSIS — Z7901 Long term (current) use of anticoagulants: Secondary | ICD-10-CM | POA: Diagnosis not present

## 2017-06-30 DIAGNOSIS — C3490 Malignant neoplasm of unspecified part of unspecified bronchus or lung: Secondary | ICD-10-CM

## 2017-06-30 NOTE — Progress Notes (Signed)
[facility] NOTE  Patient Care Team: Glenda Chroman, MD as PCP - General (Internal Medicine)  CHIEF COMPLAINTS/PURPOSE OF CONSULTATION:  Recently diagnosed adenocarcinoma of lung   HISTORY OF PRESENTING ILLNESS:  Tasha [name] 52 y.o. female is here because of recently diagnosed lung cancer; referred from [facility].   She was having pre-op evaluation for consideration for kidney transplant. She was seen by Dr. Berniece Pap with pulmonary at [facility] for new patient consultation on 06/04/17. She was noted to have had recent abnormal CT chest (as part of her kidney transplant work-up), as well as streaky hemoptysis for the past 1 month at that time.  CT chest on 05/19/2017 showed spiculated solid nodule right lower lobe measuring 1.6 x 1.2 cm.  Fullness of the right hilum with calcifications concerning for hilar adenopathy, although evaluation limited by noncontrast technique.  Nodular soft tissue density within the anti-dependent portions of the right bronchus intermedius concerning for airway invasion.  Underwent EBUS at [facility] on 06/14/17 with endobronchial biopsy and FNA of right hilar mass. Pathology of endobronchial biopsy revealed invasive, poorly-differentiated adenocarcinoma.  Path report states that ALK, ROS1, EGFR, and PD-L1 were performed. EGFR negative.  Other results are not available for review at time of today's visit at [facility].  FNA of right hilar mass malignant cells consistent with carcinoma.   Based on review of CareEverywhere via [place], she has not had any other scans or evaluation since the CT and EBUS/biopsy recently.       INTERVAL HISTORY:  Tasha [name] 52 y.o. female here today for initial consultation for recently diagnosed lung cancer.   Here today with her husband.  She has been continuing to struggle with hemoptysis since the bronchoscopy. Notes to have been "spitting up blood", including large  clots since her procedure.    She is coumadin for h/o recurrent strokes. She stopped the coumadin before recent EBUS procedure, but she was continuing to have bleeding so she has not resumed it.  She has been taking aspirin 81 mg daily instead. Recommended she take aspirin 325 mg daily instead.    Quit smoking in 2010; smoked 1/2-1 ppd x 25 years. Endorses significant 2nd hand smoking exposure as well.   She has lost ~10 lbs in the past few months. Endorses that her appetite is "okay, but I've never had a big appetite. It's about 50%."  Denies headaches and vision changes/diplopia.  She has had chronic cough for past several months; the cough sometimes leads to gagging and vomiting.  Endorses sore throat recently since procedure.   Denies peripheral neuropathy or lower extremity swelling.    Her nephrologist is Dr. Nilda Calamity. She has been told that she has stage 5 chronic kidney disease.     MEDICAL HISTORY:  Past Medical History:  Diagnosis Date  . Anemia   . Aortic insufficiency   . Asthma   . Breast nodule   . Carotid artery disease ([place])   . 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 CKD (chronic kidney disease) stage 4, GFR 15-29 ml/min (HCC)   . COPD (chronic obstructive pulmonary disease) ([place])   . CRI (chronic renal insufficiency)   . Folic acid deficiency   . Glaucoma   . Graves disease   . Hyperlipidemia   . Hypertension   . Iron deficiency   . Lung cancer ([place])    non-small cell right lung  . Recurrent  UTI   . Renal cyst   . Systemic vasculitis ([place])    Not otherwise specified, Dr. Sonny Dandy    SURGICAL HISTORY: Past Surgical History:  Procedure Laterality Date  . BRONCHOSCOPY    . COLONOSCOPY    . ORIF ORBITAL FRACTURE  1996   Left  . SPINAL FUSION  1994   At [facility]    SOCIAL HISTORY: Social History   Socioeconomic History  . Marital status: Married    Spouse name: Not on file  . Number of children: Not on file  . Years of  education: Not on file  . Highest education level: Not on file  Occupational History  . Not on file  Social Needs  . Financial resource strain: Not on file  . Food insecurity:    Worry: Not on file    Inability: Not on file  . Transportation needs:    Medical: Not on file    Non-medical: Not on file  Tobacco Use  . Smoking status: Former Smoker    Years: 25.00    Types: Cigarettes    Last attempt to quit: 02/24/2008    Years since quitting: 9.3  . Smokeless tobacco: Never Used  Substance and Sexual Activity  . Alcohol use: Not Currently  . Drug use: No  . Sexual activity: Not on file  Lifestyle  . Physical activity:    Days per week: Not on file    Minutes per session: Not on file  . Stress: Not on file  Relationships  . Social connections:    Talks on phone: Not on file    Gets together: Not on file    Attends religious service: Not on file    Active member of club or organization: Not on file    Attends meetings of clubs or organizations: Not on file    Relationship status: Not on file  . Intimate partner violence:    Fear of current or ex partner: Not on file    Emotionally abused: Not on file    Physically abused: Not on file    Forced sexual activity: Not on file  Other Topics Concern  . Not on file  Social History Narrative   No regular exercise.    FAMILY HISTORY: Family History  Problem Relation Age of Onset  . Heart attack Father   . Diverticulitis Mother   . Fibromyalgia Mother   . Hypertension Brother     ALLERGIES:  is allergic to sulfonamide derivatives.  MEDICATIONS:  Current Outpatient Medications  Medication Sig Dispense Refill  . albuterol (PROAIR HFA) 108 (90 BASE) MCG/ACT inhaler Inhale into the lungs as needed.      Marland Kitchen amLODipine (NORVASC) 10 MG tablet Take 10 mg by mouth daily.    . calcitRIOL (ROCALTROL) 0.25 MCG capsule Take 1 capsule by mouth daily.    . ferrous sulfate 325 (65 FE) MG tablet Take 325 mg by mouth daily.      . folic  acid (FOLVITE) 1 MG tablet Take 1 mg by mouth daily.      . metoprolol tartrate (LOPRESSOR) 25 MG tablet Take 25 mg by mouth 2 (two) times daily.     Marland Kitchen NIFEdipine (PROCARDIA-XL/ADALAT CC) 60 MG 24 hr tablet Take by mouth.    . sodium bicarbonate 650 MG tablet Take 650 mg by mouth 2 (two) times daily.     . Travoprost (TRAVATAN OP) Apply 1 drop to eye at bedtime. Right eye    . warfarin (COUMADIN)  5 MG tablet Take 5 mg by mouth daily. MANAGED BY PMD    . WELCHOL 3.75 g PACK Take 1 packet by mouth daily.     No current facility-administered medications for this visit.     REVIEW OF SYSTEMS:   Constitutional: Denies fevers, chills or abnormal night sweats.  Weight loss of 10 pounds.  Decreased appetite. Eyes: Denies blurriness of vision, double vision or watery eyes Ears, nose, mouth, throat, and face: Denies mucositis or sore throat Respiratory: Denies dyspnea or wheezes.  Cough with hemoptysis present. Cardiovascular: Denies palpitation, chest discomfort or lower extremity swelling Gastrointestinal:  Denies nausea, heartburn or change in bowel habits Skin: Denies abnormal skin rashes Lymphatics: Denies new lymphadenopathy or easy bruising Neurological:Denies numbness, tingling or new weaknesses Behavioral/Psych: Mood is stable, no new changes  All other systems were reviewed with the patient and are negative.  PHYSICAL EXAMINATION: ECOG PERFORMANCE STATUS: 1 - Symptomatic but completely ambulatory  Vitals:   06/30/17 1509  BP: (!) 141/64  Pulse: 68  Resp: 16  Temp: 98.3 F (36.8 C)  SpO2: 100%   Filed Weights   06/30/17 1509  Weight: 86 lb 6.4 oz (39.2 kg)    GENERAL:alert, no distress and comfortable SKIN: skin color, texture, turgor are normal, no rashes or significant lesions EYES: normal, conjunctiva are pink and non-injected, sclera clear OROPHARYNX:no exudate, no erythema and lips, buccal mucosa, and tongue normal  NECK: supple, thyroid normal size, non-tender,  without nodularity LYMPH:  no palpable lymphadenopathy in the cervical, axillary or inguinal LUNGS: clear to auscultation and percussion with normal breathing effort HEART: regular rate & rhythm and no murmurs and no lower extremity edema ABDOMEN:abdomen soft, non-tender and normal bowel sounds Musculoskeletal:no cyanosis of digits and no clubbing  PSYCH: alert & oriented x 3 with fluent speech  LABORATORY DATA:  I have reviewed the data as listed Lab Results  Component Value Date   WBC 7.3 06/09/2016   HGB 10.3 (L) 06/09/2016   HCT 29.9 (L) 06/09/2016   MCV 99.3 06/09/2016   PLT 378 06/09/2016     Chemistry      Component Value Date/Time   NA 126 (L) 06/09/2016 1354   NA 125 (A) 10/19/2015   K 4.7 06/09/2016 1354   CL 90 (L) 06/09/2016 1354   CO2 22 06/09/2016 1354   BUN 43 (H) 06/09/2016 1354   BUN 26 (A) 10/19/2015   CREATININE 3.56 (H) 06/09/2016 1354   GLU 93 10/19/2015      Component Value Date/Time   CALCIUM 9.4 06/09/2016 1354   ALKPHOS 73 06/09/2016 1354   AST 24 06/09/2016 1354   ALT 9 06/09/2016 1354   BILITOT 0.4 06/09/2016 1354      PATHOLOGY:       RADIOGRAPHIC STUDIES: CT chest without contrast: 05/19/17 (Done at [facility]; results reviewed in [place])        ASSESSMENT & PLAN:  Malignant neoplasm of bronchus and lung ([place]) 1.  Clinical stage IIB (T1BN1) right lung poorly differentiated adenocarcinoma, EGFR negative: -Patient underwent a CT scan of the chest for work-up of kidney transplant and was found to have spiculated solid nodule in the posterior right lower lobe measuring 1.6 x 1.2 cm with fullness in the right hilum.  On 06/15/2011 she underwent EBUS guided biopsy.  7S lymph node was negative for malignancy.  Right hilar mass was positive for carcinoma.  Endobronchial biopsy showed poorly differentiated adenocarcinoma.  She also had endobronchial tumor debulked (75% obstructing reduced to  25% obstructing).  Full EBUS staging shows no  adenopathy at 11 L, 10 L, 4L or 4R.  Left mainstem and segments clear.  Right upper lobe normal.  BI had tumor 1.5 cm distal to right upper lobe takeoff. - Her hemoptysis has improved since she stopped taking Coumadin.  She is taking baby aspirin.  I have suggested her to increase it to full dose aspirin.  She is on Coumadin for vasculitis and strokes. -I have discussed with her and her husband about CT scan reports and pathology reports.  I have recommended doing an MRI of the brain without contrast given her chronic kidney disease.  We will also do a PET CT scan for further staging.  I will see her back after the above-mentioned scans to discuss the results and accurate staging.  2.  CKD: She has stage V CKD.  Hence she was being worked up for kidney transplant which is put on hold at this time.  She follows up with Dr. Elita Quick in [place].  Orders Placed This Encounter  Procedures  . MR Brain Wo Contrast    Standing Status:   Future    Standing Expiration Date:   06/30/2018    Order Specific Question:   What is the patient's sedation requirement?    Answer:   No Sedation    Order Specific Question:   Does the patient have a pacemaker or implanted devices?    Answer:   No    Order Specific Question:   Use SRS Protocol?    Answer:   No    Order Specific Question:   Preferred imaging location?    Answer:   [facility] (table limit-350lbs)    Order Specific Question:   Radiology Contrast Protocol - do NOT remove file path    Answer:   \\[email or web address]    Order Specific Question:   Reason for Exam additional comments    Answer:   recent diagnosis of adenocarcinoma of endobronchus, RLL, and right hilum; initial staging for treatment consideration    Comments:   stage 5 CKD; no contrast media   . NM PET Image Initial (PI) Skull Base To Thigh    Standing Status:   Future    Standing Expiration Date:   06/30/2018    Order Specific Question:   If indicated for the  ordered procedure, I authorize the administration of a radiopharmaceutical per Radiology protocol    Answer:   Yes    Order Specific Question:   Is the patient pregnant?    Answer:   No    Order Specific Question:   Preferred imaging location?    Answer:   [facility]    Order Specific Question:   Radiology Contrast Protocol - do NOT remove file path    Answer:   \\[email or web address]    Order Specific Question:   Reason for Exam additional comments    Answer:   recent diagnosis of adenocarcinoma of endobronchus, RLL, and right hilum; initial staging for treatment consideration    All questions were answered. The patient knows to call the clinic with any problems, questions or concerns.    This note includes documentation from Mike Craze, NP, who was present during this patient's office visit and evaluation.  I have reviewed this note for its completeness and accuracy.  I have edited this note accordingly based on my findings and medical opinion.      Derek Jack, MD 06/30/2017 9:11 PM

## 2017-06-30 NOTE — Assessment & Plan Note (Addendum)
1.  Clinical stage IIB (T1BN1) right lung poorly differentiated adenocarcinoma, EGFR negative: -Patient underwent a CT scan of the chest for work-up of kidney transplant and was found to have spiculated solid nodule in the posterior right lower lobe measuring 1.6 x 1.2 cm with fullness in the right hilum.  On 06/15/2011 she underwent EBUS guided biopsy.  7S lymph node was negative for malignancy.  Right hilar mass was positive for carcinoma.  Endobronchial biopsy showed poorly differentiated adenocarcinoma.  She also had endobronchial tumor debulked (75% obstructing reduced to 25% obstructing).  Full EBUS staging shows no adenopathy at 11 L, 10 L, 4L or 4R.  Left mainstem and segments clear.  Right upper lobe normal.  BI had tumor 1.5 cm distal to right upper lobe takeoff. - Her hemoptysis has improved since she stopped taking Coumadin.  She is taking baby aspirin.  I have suggested her to increase it to full dose aspirin.  She is on Coumadin for vasculitis and strokes. -I have discussed with her and her husband about CT scan reports and pathology reports.  I have recommended doing an MRI of the brain without contrast given her chronic kidney disease.  We will also do a PET CT scan for further staging.  I will see her back after the above-mentioned scans to discuss the results and accurate staging.  2.  CKD: She has stage V CKD.  Hence she was being worked up for kidney transplant which is put on hold at this time.  She follows up with Dr. Elita Quick in [place].

## 2017-07-05 ENCOUNTER — Encounter (HOSPITAL_COMMUNITY)
Admission: RE | Admit: 2017-07-05 | Discharge: 2017-07-05 | Disposition: A | Discharge status: Home / Self Care | Payer: Managed Care, Other (non HMO) | Source: Ambulatory Visit | Attending: Hematology | Admitting: Hematology

## 2017-07-05 DIAGNOSIS — C3490 Malignant neoplasm of unspecified part of unspecified bronchus or lung: Secondary | ICD-10-CM

## 2017-07-05 DIAGNOSIS — R590 Localized enlarged lymph nodes: Secondary | ICD-10-CM | POA: Diagnosis present

## 2017-07-05 MED ORDER — FLUDEOXYGLUCOSE F - 18 (FDG) INJECTION
10.0000 | Freq: Once | INTRAVENOUS | Status: AC | PRN
  Administered 2017-07-05: 10.54 via INTRAVENOUS

## 2017-07-07 ENCOUNTER — Ambulatory Visit (HOSPITAL_COMMUNITY)
Admission: RE | Admit: 2017-07-07 | Discharge: 2017-07-07 | Disposition: A | Discharge status: Home / Self Care | Payer: Managed Care, Other (non HMO) | Source: Ambulatory Visit | Attending: Hematology | Admitting: Hematology

## 2017-07-07 DIAGNOSIS — C3490 Malignant neoplasm of unspecified part of unspecified bronchus or lung: Secondary | ICD-10-CM

## 2017-07-07 DIAGNOSIS — R590 Localized enlarged lymph nodes: Secondary | ICD-10-CM | POA: Diagnosis not present

## 2017-07-07 DIAGNOSIS — Z8673 Personal history of transient ischemic attack (TIA), and cerebral infarction without residual deficits: Secondary | ICD-10-CM | POA: Insufficient documentation

## 2017-07-08 ENCOUNTER — Inpatient Hospital Stay (HOSPITAL_BASED_OUTPATIENT_CLINIC_OR_DEPARTMENT_OTHER): Payer: Managed Care, Other (non HMO) | Admitting: Hematology

## 2017-07-08 ENCOUNTER — Encounter (HOSPITAL_COMMUNITY): Payer: Self-pay | Admitting: Hematology

## 2017-07-08 DIAGNOSIS — N185 Chronic kidney disease, stage 5: Secondary | ICD-10-CM | POA: Diagnosis not present

## 2017-07-08 DIAGNOSIS — C3401 Malignant neoplasm of right main bronchus: Secondary | ICD-10-CM | POA: Diagnosis not present

## 2017-07-08 DIAGNOSIS — C3490 Malignant neoplasm of unspecified part of unspecified bronchus or lung: Secondary | ICD-10-CM

## 2017-07-08 DIAGNOSIS — Z8673 Personal history of transient ischemic attack (TIA), and cerebral infarction without residual deficits: Secondary | ICD-10-CM | POA: Diagnosis not present

## 2017-07-08 NOTE — Patient Instructions (Signed)
[facility] at [facility] Discharge Instructions  You saw Dr. Higgs today.   Thank you for choosing  Cancer Center at [facility] to provide your oncology and hematology care.  To afford each patient quality time with our provider, please arrive at least 15 minutes before your scheduled appointment time.   If you have a lab appointment with the Cancer Center please come in thru the  Main Entrance and check in at the main information desk  You need to re-schedule your appointment should you arrive 10 or more minutes late.  We strive to give you quality time with our providers, and arriving late affects you and other patients whose appointments are after yours.  Also, if you no show three or more times for appointments you may be dismissed from the clinic at the providers discretion.     Again, thank you for choosing [facility].  Our hope is that these requests will decrease the amount of time that you wait before being seen by our physicians.       _____________________________________________________________  Should you have questions after your visit to  Cancer Center, please contact our office at [phone] between the hours of 8:30 a.m. and 4:30 p.m.  Voicemails left after 4:30 p.m. will not be returned until the following business day.  For prescription refill requests, have your pharmacy contact our office.       Resources For Cancer Patients and their Caregivers ? American Cancer Society: Can assist with transportation, wigs, general needs, runs Look Good Feel Better.        1-888-227-6333 ? Cancer Care: Provides financial assistance, online support groups, medication/co-pay assistance.  1-800-813-HOPE (4673) ? [facility] Assists [place] cancer patients and their families through emotional , educational and financial support.  336-427-4357 ? [place] DSS Where to apply for food  stamps, Medicaid and utility assistance. 336-342-1394 ? [facility]: Transportation to medical appointments. 336-347-2287 ? Social Security Administration: May apply for disability if have a Stage IV cancer. 336-342-7796 1-800-772-1213 ? [place] Aging, Disability and Transit Services: Assists with nutrition, care and transit needs. 336-349-2343  Cancer Center Support Programs:   > Cancer Support Group  2nd Tuesday of the month 1pm-2pm, Journey Room   > Creative Journey  3rd Tuesday of the month 1130am-1pm, Journey Room     

## 2017-07-08 NOTE — Assessment & Plan Note (Signed)
1.  Clinical stage IIB (T1bN1) right lung poorly differentiated adenocarcinoma, EGFR negative: -Patient underwent a CT scan of the chest for work-up of kidney transplant at [facility] and was found to have spiculated solid nodule in the posterior right lower lobe measuring 1.6 x 1.2 cm with fullness in the right hilum.  On 06/15/2011 she underwent EBUS guided biopsy.  7S lymph node was negative for malignancy.  Right hilar mass was positive for carcinoma.  Endobronchial biopsy showed poorly differentiated adenocarcinoma.  She also had endobronchial tumor debulked (75% obstructing reduced to 25% obstructing). BI had tumor 1.5 cm distal to right upper lobe takeoff.  Full EBUS staging shows no adenopathy at 11 L, 10 L, 4L or 4R.  Left mainstem and segments clear.  Right upper lobe normal.  - Her hemoptysis has improved since she stopped taking Coumadin.  She is taking baby aspirin.  I have suggested her to increase it to full dose aspirin.  She is on Coumadin for vasculitis and strokes. -We talked about the results of the PET CT scan dated 07/05/2017 which showed 1.5 cm medial right lower lobe nodule, and right perihilar mass with no evidence of distant metastasis.  We also talked about MRI of the brain without contrast dated 07/07/2017 which did not show any intracranial metastasis.  I have recommended seeking a consultation with Dr. Roxan Hockey to see if resection is a possibility.  Patient reports that she had PFTs done at [facility] 2 months ago.  We will see her back after she sees Dr. Roxan Hockey.  2.  CKD: She has stage V CKD.  Hence she was being worked up for kidney transplant which is put on hold at this time.  She follows up with Dr. Elita Quick in [place].

## 2017-07-08 NOTE — Progress Notes (Signed)
[facility] FOLLOW-UP NOTE  Patient Care Team: Glenda Chroman, MD as PCP - General (Internal Medicine)  CHIEF COMPLAINTS/PURPOSE OF CONSULTATION:  Recently diagnosed adenocarcinoma of lung   HISTORY OF PRESENTING ILLNESS:  Tasha [name] 52 y.o. female is here because of recently diagnosed lung cancer; referred from [facility].   She was having pre-op evaluation for consideration for kidney transplant. She was seen by Dr. Berniece Pap with pulmonary at [facility] for new patient consultation on 06/04/17. She was noted to have had recent abnormal CT chest (as part of her kidney transplant work-up), as well as streaky hemoptysis for the past 1 month at that time.  CT chest on 05/19/2017 showed spiculated solid nodule right lower lobe measuring 1.6 x 1.2 cm.  Fullness of the right hilum with calcifications concerning for hilar adenopathy, although evaluation limited by noncontrast technique.  Nodular soft tissue density within the anti-dependent portions of the right bronchus intermedius concerning for airway invasion.  Underwent EBUS at [facility] on 06/14/17 with endobronchial biopsy and FNA of right hilar mass. Pathology of endobronchial biopsy revealed invasive, poorly-differentiated adenocarcinoma.  Path report states that ALK, ROS1, EGFR, and PD-L1 were performed. EGFR negative.  Other results are not available for review at time of today's visit at [facility].  FNA of right hilar mass malignant cells consistent with carcinoma.   Based on review of CareEverywhere via [place], she has not had any other scans or evaluation since the CT and EBUS/biopsy recently.       INTERVAL HISTORY:  Tasha [name] 52 y.o. female here today for initial consultation for recently diagnosed lung cancer.   Here today with her husband.  Denies any hemoptysis since last visit.    Reviewed recent scans/work-up with her. Discussed staging of lung cancer. This appears to  be a Stage II lung cancer.  Recommend referral to cardiothoracic surgeon to discuss surgical excision.  Discussed potential of chemo and radiation.   She states that she had PFTs done a couple of months ago at [facility].      MEDICAL HISTORY:  Past Medical History:  Diagnosis Date  . Anemia   . Aortic insufficiency   . Asthma   . Breast nodule   . Carotid artery disease ([place])   . 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 CKD (chronic kidney disease) stage 4, GFR 15-29 ml/min (HCC)   . COPD (chronic obstructive pulmonary disease) ([place])   . CRI (chronic renal insufficiency)   . Folic acid deficiency   . Glaucoma   . Graves disease   . Hyperlipidemia   . Hypertension   . Iron deficiency   . Lung cancer ([place])    non-small cell right lung  . Recurrent UTI   . Renal cyst   . 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 Sonny Dandy    SURGICAL HISTORY: Past Surgical History:  Procedure Laterality Date  . BRONCHOSCOPY    . COLONOSCOPY    . ORIF ORBITAL FRACTURE  1996   Left  . SPINAL FUSION  1994   At C3-5    SOCIAL HISTORY: Social History   Socioeconomic History  . Marital status: Married    Spouse name: Not on file  . Number of children: Not on file  . Years of education: Not on file  . Highest education level: Not on file  Occupational History  . Not  on file  Social Needs  . Financial resource strain: Not on file  . Food insecurity:    Worry: Not on file    Inability: Not on file  . Transportation needs:    Medical: Not on file    Non-medical: Not on file  Tobacco Use  . Smoking status: Former Smoker    Years: 25.00    Types: Cigarettes    Last attempt to quit: 02/24/2008    Years since quitting: 9.3  . Smokeless tobacco: Never Used  Substance and Sexual Activity  . Alcohol use: Not Currently  . Drug use: No  . Sexual activity: Not on file  Lifestyle  . Physical activity:    Days per  week: Not on file    Minutes per session: Not on file  . Stress: Not on file  Relationships  . Social connections:    Talks on phone: Not on file    Gets together: Not on file    Attends religious service: Not on file    Active member of club or organization: Not on file    Attends meetings of clubs or organizations: Not on file    Relationship status: Not on file  . Intimate partner violence:    Fear of current or ex partner: Not on file    Emotionally abused: Not on file    Physically abused: Not on file    Forced sexual activity: Not on file  Other Topics Concern  . Not on file  Social History Narrative   No regular exercise.    FAMILY HISTORY: Family History  Problem Relation Age of Onset  . Heart attack Father   . Diverticulitis Mother   . Fibromyalgia Mother   . Hypertension Brother     ALLERGIES:  is allergic to sulfonamide derivatives.  MEDICATIONS:  Current Outpatient Medications  Medication Sig Dispense Refill  . albuterol (PROAIR HFA) 108 (90 BASE) MCG/ACT inhaler Inhale into the lungs as needed.      Marland Kitchen amLODipine (NORVASC) 10 MG tablet Take 10 mg by mouth daily.    . calcitRIOL (ROCALTROL) 0.25 MCG capsule Take 1 capsule by mouth daily.    . ferrous sulfate 325 (65 FE) MG tablet Take 325 mg by mouth daily.      . folic acid (FOLVITE) 1 MG tablet Take 1 mg by mouth daily.      . metoprolol tartrate (LOPRESSOR) 25 MG tablet Take 25 mg by mouth 2 (two) times daily.     Marland Kitchen NIFEdipine (PROCARDIA-XL/ADALAT CC) 60 MG 24 hr tablet Take by mouth.    . sodium bicarbonate 650 MG tablet Take 650 mg by mouth 2 (two) times daily.     . Travoprost (TRAVATAN OP) Apply 1 drop to eye at bedtime. Right eye    . warfarin (COUMADIN) 5 MG tablet Take 5 mg by mouth daily. MANAGED BY PMD    . WELCHOL 3.75 g PACK Take 1 packet by mouth daily.     No current facility-administered medications for this visit.     REVIEW OF SYSTEMS:   Constitutional: Denies fevers, chills or  abnormal night sweats.  Weight loss of 10 pounds.  Decreased appetite. Eyes: Denies blurriness of vision, double vision or watery eyes Ears, nose, mouth, throat, and face: Denies mucositis or sore throat Respiratory: Denies dyspnea or wheezes.  She has not had hemoptysis since she stopped Coumadin. Cardiovascular: Denies palpitation, chest discomfort or lower extremity swelling Gastrointestinal:  Denies nausea, heartburn or change in  bowel habits Skin: Denies abnormal skin rashes Lymphatics: Denies new lymphadenopathy or easy bruising Neurological:Denies numbness, tingling or new weaknesses Behavioral/Psych: Mood is stable, no new changes  All other systems were reviewed with the patient and are negative.  PHYSICAL EXAMINATION: ECOG PERFORMANCE STATUS: 1 - Symptomatic but completely ambulatory  I have reviewed her vitals today. GENERAL:alert, no distress and comfortable SKIN: skin color, texture, turgor are normal, no rashes or significant lesions EYES: normal, conjunctiva are pink and non-injected, sclera clear   LABORATORY DATA:  I have reviewed the data as listed Lab Results  Component Value Date   WBC 7.3 06/09/2016   HGB 10.3 (L) 06/09/2016   HCT 29.9 (L) 06/09/2016   MCV 99.3 06/09/2016   PLT 378 06/09/2016     Chemistry      Component Value Date/Time   NA 126 (L) 06/09/2016 1354   NA 125 (A) 10/19/2015   K 4.7 06/09/2016 1354   CL 90 (L) 06/09/2016 1354   CO2 22 06/09/2016 1354   BUN 43 (H) 06/09/2016 1354   BUN 26 (A) 10/19/2015   CREATININE 3.56 (H) 06/09/2016 1354   GLU 93 10/19/2015      Component Value Date/Time   CALCIUM 9.4 06/09/2016 1354   ALKPHOS 73 06/09/2016 1354   AST 24 06/09/2016 1354   ALT 9 06/09/2016 1354   BILITOT 0.4 06/09/2016 1354      PATHOLOGY:       RADIOGRAPHIC STUDIES: CT chest without contrast: 05/19/17 (Done at [facility]; results reviewed in [place])        ASSESSMENT & PLAN:  Malignant neoplasm of bronchus and  lung ([place]) 1.  Clinical stage IIB (T1bN1) right lung poorly differentiated adenocarcinoma, EGFR negative: -Patient underwent a CT scan of the chest for work-up of kidney transplant at [facility] and was found to have spiculated solid nodule in the posterior right lower lobe measuring 1.6 x 1.2 cm with fullness in the right hilum.  On 06/15/2011 she underwent EBUS guided biopsy.  7S lymph node was negative for malignancy.  Right hilar mass was positive for carcinoma.  Endobronchial biopsy showed poorly differentiated adenocarcinoma.  She also had endobronchial tumor debulked (75% obstructing reduced to 25% obstructing). BI had tumor 1.5 cm distal to right upper lobe takeoff.  Full EBUS staging shows no adenopathy at 11 L, 10 L, 4L or 4R.  Left mainstem and segments clear.  Right upper lobe normal.  - Her hemoptysis has improved since she stopped taking Coumadin.  She is taking baby aspirin.  I have suggested her to increase it to full dose aspirin.  She is on Coumadin for vasculitis and strokes. -We talked about the results of the PET CT scan dated 07/05/2017 which showed 1.5 cm medial right lower lobe nodule, and right perihilar mass with no evidence of distant metastasis.  We also talked about MRI of the brain without contrast dated 07/07/2017 which did not show any intracranial metastasis.  I have recommended seeking a consultation with Dr. Roxan Hockey to see if resection is a possibility.  Patient reports that she had PFTs done at [facility] 2 months ago.  We will see her back after she sees Dr. Roxan Hockey.  2.  CKD: She has stage V CKD.  Hence she was being worked up for kidney transplant which is put on hold at this time.  She follows up with Dr. Elita Quick in [place].   All questions were answered. The patient knows to call the clinic with any problems,  questions or concerns.    This note includes documentation from Mike Craze, NP, who was present during this patient's office visit and  evaluation.  I have reviewed this note for its completeness and accuracy.  I have edited this note accordingly based on my findings and medical opinion.      Derek Jack, MD 07/08/2017 3:43 PM

## 2017-07-13 ENCOUNTER — Other Ambulatory Visit: Payer: Self-pay

## 2017-07-13 ENCOUNTER — Encounter: Payer: Self-pay | Admitting: Thoracic Surgery (Cardiothoracic Vascular Surgery)

## 2017-07-13 ENCOUNTER — Institutional Professional Consult (permissible substitution): Payer: Managed Care, Other (non HMO) | Admitting: Thoracic Surgery (Cardiothoracic Vascular Surgery)

## 2017-07-13 VITALS — BP 150/60 | HR 69 | Resp 18 | Ht 61.0 in | Wt 89.8 lb

## 2017-07-13 DIAGNOSIS — C3490 Malignant neoplasm of unspecified part of unspecified bronchus or lung: Secondary | ICD-10-CM

## 2017-07-13 NOTE — Progress Notes (Signed)
PCP is Glenda Chroman, MD Referring Provider is Derek Jack, MD  Chief Complaint  Patient presents with  . Lung Cancer    new patient, PET 07/05/2017    HPI: Tasha [name] is sent for consultation regarding stage IIB poorly differentiated carcinoma  Tasha [name] is a 52 year old woman with a history of tobacco abuse (quit in 2010), stage V chronic kidney disease, COPD, Graves' disease, unclassifiable autoimmune vasculitis, hypercoagulability, glaucoma, hypertension, hyperlipidemia, cerebrovascular disease with previous posterior circulation stroke, moderate aortic insufficiency, and anemia.  Being evaluated for a possible renal transplant.  During her work-up the question arose of her possibly having an enlarged aorta.  She is not sure if this was due to chest x-ray findings.  An echocardiogram and CT were ordered.  The echocardiogram showed preserved left ventricular function with moderate aortic insufficiency and mild mitral insufficiency.  CT of the chest showed a right lower lobe lung nodule and hilar fullness.  She was having hemoptysis but that improved after she was taken off of Coumadin.  She underwent bronchoscopy and endobronchial ultrasound at [facility].  That revealed an endobronchial mass lesion with 75% obstruction of the airway.  It turned out to be a poorly differentiated adenocarcinoma.  Aspiration of the level 7 node was negative for malignancy.  MRI of the brain was negative for malignancy.  PET/CT showed marked hypermetabolic activity in the hilar mass as well as the right lower lobe lung nodule.  Past Medical History:  Diagnosis Date  . Anemia   . Aortic insufficiency   . Asthma   . Breast nodule   . Carotid artery disease ([place])   . 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 CKD (chronic kidney disease) stage 4, GFR 15-29 ml/min (HCC)   . COPD (chronic obstructive pulmonary disease) ([place])   . CRI (chronic renal  insufficiency)   . Folic acid deficiency   . Glaucoma   . Graves disease   . Hyperlipidemia   . Hypertension   . Iron deficiency   . Lung cancer ([place])    non-small cell right lung  . Recurrent UTI   . Renal cyst   . 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 Sonny Dandy    Past Surgical History:  Procedure Laterality Date  . BRONCHOSCOPY    . COLONOSCOPY    . ORIF ORBITAL FRACTURE  1996   Left  . SPINAL FUSION  1994   At C3-5    Family History  Problem Relation Age of Onset  . Heart attack Father   . Diverticulitis Mother   . Fibromyalgia Mother   . Hypertension Brother     Social History Social History   Tobacco Use  . Smoking status: Former Smoker    Years: 25.00    Types: Cigarettes    Last attempt to quit: 02/24/2008    Years since quitting: 9.3  . Smokeless tobacco: Never Used  Substance Use Topics  . Alcohol use: Not Currently  . Drug use: No    Current Outpatient Medications  Medication Sig Dispense Refill  . albuterol (PROAIR HFA) 108 (90 BASE) MCG/ACT inhaler Inhale into the lungs as needed.      Marland Kitchen amLODipine (NORVASC) 10 MG tablet Take 10 mg by mouth daily.    . calcitRIOL (ROCALTROL) 0.25 MCG capsule Take 1 capsule by mouth daily.    . ferrous sulfate 325 (65 FE) MG tablet Take 325 mg by mouth daily.      Marland Kitchen  folic acid (FOLVITE) 1 MG tablet Take 1 mg by mouth daily.      . metoprolol tartrate (LOPRESSOR) 25 MG tablet Take 25 mg by mouth 2 (two) times daily.     Marland Kitchen NIFEdipine (PROCARDIA-XL/ADALAT CC) 60 MG 24 hr tablet Take by mouth.    . sodium bicarbonate 650 MG tablet Take 650 mg by mouth 2 (two) times daily.     . Travoprost (TRAVATAN OP) Apply 1 drop to eye at bedtime. Right eye    . WELCHOL 3.75 g PACK Take 1 packet by mouth daily.    Marland Kitchen warfarin (COUMADIN) 5 MG tablet Take 5 mg by mouth daily. MANAGED BY PMD     No current facility-administered medications for this visit.     Allergies  Allergen Reactions  . Sulfonamide  Derivatives     REACTION: rash    Review of Systems  Constitutional: Negative for activity change, fatigue and unexpected weight change.  HENT: Positive for dental problem. Negative for trouble swallowing and voice change.   Eyes: Negative for visual disturbance.  Respiratory: Positive for cough (Hemoptysis), shortness of breath (Better after bronchoscopic debulking) and wheezing.   Gastrointestinal: Positive for blood in stool (Not recent).  Genitourinary: Negative for difficulty urinating and dysuria.  Musculoskeletal: Positive for arthralgias, gait problem and myalgias.  Neurological: Negative for seizures and weakness.       Prior stroke  Hematological:       Clotting disorder on Coumadin  All other systems reviewed and are negative.   BP (!) 150/60 (BP Location: Left Arm, Patient Position: Sitting, Cuff Size: Small)   Pulse 69   Resp 18   Ht 5\' 1"  (1.549 m)   Wt 89 lb 12.8 oz (40.7 kg)   SpO2 99% Comment: RA  BMI 16.97 kg/m  Physical Exam  Constitutional: She is oriented to person, place, and time. She appears well-developed and well-nourished. No distress.  HENT:  Head: Normocephalic and atraumatic.  Mouth/Throat: No oropharyngeal exudate.  Eyes: Conjunctivae and EOM are normal. No scleral icterus.  Neck: No thyromegaly present.  Cardiovascular: Normal rate and regular rhythm.  Murmur (2/6 systolic) heard. Pulmonary/Chest: Effort normal and breath sounds normal. No respiratory distress. She has no wheezes.  Abdominal: Soft. She exhibits no distension. There is no tenderness.  Musculoskeletal: She exhibits no edema.  Lymphadenopathy:    She has no cervical adenopathy.  Neurological: She is alert and oriented to person, place, and time. No cranial nerve deficit. She exhibits normal muscle tone. Coordination normal.  Skin: Skin is warm and dry.  Vitals reviewed.    Diagnostic Tests: NUCLEAR MEDICINE PET SKULL BASE TO THIGH  TECHNIQUE: 10.54 mCi F-18 FDG was  injected intravenously. Full-ring PET imaging was performed from the skull base to thigh after the radiotracer. CT data was obtained and used for attenuation correction and anatomic localization.  Fasting blood glucose: 120 mg/dl  COMPARISON:  None.  FINDINGS: Mediastinal blood pool activity: SUV max 2.0  NECK: No hypermetabolic cervical lymphadenopathy.  Incidental CT findings: none  CHEST: 1.5 x 1.2 mm irregular/spiculated medial right lower lobe nodule (series 3/image 87), max SUV 5.8, suspicious for primary bronchogenic neoplasm.  Right perihilar mass, max SUV 11.6, suspicious for nodal metastasis.  Associated right middle lobe atelectasis/postobstructive opacity (series 3/image 99).  Incidental CT findings: Atherosclerotic calcifications of the aortic arch. Coronary atherosclerosis of the LAD.  ABDOMEN/PELVIS: No hypermetabolic abdominopelvic lymphadenopathy.  No abnormal hypermetabolic lesions in the liver, spleen, pancreas, or adrenal glands.  Incidental  CT findings: Bilateral renal cysts. Atherosclerotic calcifications the abdominal aorta and branch vessels. Sigmoid diverticulosis, without evidence of diverticulitis.  SKELETON: No focal hypermetabolic activity to suggest skeletal metastasis.  Incidental CT findings: none  IMPRESSION: 1.5 cm medial right lower lobe nodule, suspicious for primary bronchogenic neoplasm.  Right perihilar mass, suspicious for nodal metastasis. Associated right middle lobe atelectasis/postobstructive opacity.  No evidence of distant metastasis.   Electronically Signed   By: Julian Hy M.D.   On: 07/06/2017 09:26 I personally reviewed the PET images and concur with the findings noted above.  Impression: Tasha [name] is a 52 year old woman with a history of tobacco abuse, autoimmune vasculitis, hypercoagulability, previous stroke, stage V chronic kidney disease, COPD, hypertension, hyperlipidemia, and  Graves' disease.  She recently was being evaluated for a renal transplant.  As part of her work-up she had a chest x-ray which showed right hilar fullness.  She also was having hemoptysis at the time.  CT of the chest showed a right hilar mass as well as a 1.5 cm nodule in the superior segment of the right lower lobe.  Biopsy showed an endobronchial mass lesion with 75% occlusion of the airway.  Biopsies were positive for poorly differentiated adenocarcinoma.  Her scan has been interpreted as a right lower lobe primary with hilar adenopathy (T1, M1).  I am not sure that that is necessarily the case.  I think the endobronchial mass lesion may be the primary and the right lower lobe nodule is a metastasis to a different lobe in the same lung.  There appears to be encroachment into the mediastinum on the soft tissue windows of the PET/CT.  I do not have her CT images from [facility] available.  Either of those would be classified as T4, M1 disease.  I do not think this lesion is resectable even with a pneumonectomy.    I think her best chances with combined chemotherapy and radiation.  Plan: Follow-up with Dr. Haynes Kerns, MD [facility] [phone]

## 2017-07-30 ENCOUNTER — Other Ambulatory Visit: Payer: Self-pay

## 2017-07-30 ENCOUNTER — Encounter (HOSPITAL_COMMUNITY): Payer: Self-pay | Admitting: Hematology

## 2017-07-30 ENCOUNTER — Inpatient Hospital Stay (HOSPITAL_COMMUNITY): Payer: Managed Care, Other (non HMO) | Attending: Hematology | Admitting: Hematology

## 2017-07-30 DIAGNOSIS — R079 Chest pain, unspecified: Secondary | ICD-10-CM | POA: Diagnosis not present

## 2017-07-30 DIAGNOSIS — C3490 Malignant neoplasm of unspecified part of unspecified bronchus or lung: Secondary | ICD-10-CM

## 2017-07-30 DIAGNOSIS — N185 Chronic kidney disease, stage 5: Secondary | ICD-10-CM | POA: Diagnosis not present

## 2017-07-30 DIAGNOSIS — Z5111 Encounter for antineoplastic chemotherapy: Secondary | ICD-10-CM | POA: Insufficient documentation

## 2017-07-30 DIAGNOSIS — C3401 Malignant neoplasm of right main bronchus: Secondary | ICD-10-CM | POA: Insufficient documentation

## 2017-07-30 NOTE — Assessment & Plan Note (Addendum)
1.  Clinical stage III (T4N1) right lung poorly differentiated adenocarcinoma, EGFR negative: -Patient underwent a CT scan of the chest for work-up of kidney transplant at [facility] and was found to have spiculated solid nodule in the posterior right lower lobe measuring 1.6 x 1.2 cm with fullness in the right hilum.  On 06/15/2011 she underwent EBUS guided biopsy.  7S lymph node was negative for malignancy.  Right hilar mass was positive for carcinoma.  Endobronchial biopsy showed poorly differentiated adenocarcinoma.  She also had endobronchial tumor debulked (75% obstructing reduced to 25% obstructing). BI had tumor 1.5 cm distal to right upper lobe takeoff.  Full EBUS staging shows no adenopathy at 11 L, 10 L, 4L or 4R.  Left mainstem and segments clear.  Right upper lobe normal.  - Her hemoptysis has improved since she stopped taking Coumadin.  She takes aspirin daily.  She is on Coumadin for vasculitis and strokes. - PET CT scan dated 07/05/2017 shows 1.5 cm medial right lower lobe nodule, and right perihilar mass with no evidence of distant metastasis.  We also talked about MRI of the brain without contrast dated 07/07/2017 which did not show any intracranial metastasis. -She saw Dr. Roxan Hockey for opinion regarding surgery.  Dr. Roxan Hockey did not think she would be a surgical candidate.  He also thought the tumor is encroaching the mediastinum. -She reports slight worsening of cough particularly when lying down with occasional hemoptysis.  I think the endobronchial tumor is starting to grow back again.  She will need to start her chemoradiation therapy rather urgently.  We talked about chemotherapy containing carboplatin and paclitaxel given once a week during radiation.  This will be followed by consolidation immunotherapy with Durvalumab.  We talked about the side effects.  I have called Dr. Grandville Silos in [place] who kindly agreed to see this patient on Monday at 10:45 AM.  He may start with  radiation to control further closing down of the endobronchial tumor.  2.  CKD: She has stage V CKD.  Hence she was being worked up for kidney transplant which is put on hold at this time.  She follows up with Dr. Elita Quick in [place].

## 2017-07-30 NOTE — Progress Notes (Signed)
[address] [place], [place] [address]   CLINIC:  Medical Oncology/Hematology  PCP:  Glenda Chroman, MD [place] [address] [phone]   REASON FOR VISIT:  Follow-up for non-small cell lung cancer.  CURRENT THERAPY: Combination chemoradiation therapy being planned.   INTERVAL HISTORY:  [name] 52 y.o. female returns for follow-up of her lung cancer.  She was evaluated by Dr. Roxan Hockey on 07/13/2017.  He thought that the endobronchial mass lesion may be the primary and right lower lobe nodule is a metastasis.  He also got that there is encroachment into the mediastinum.  Hence she was upstaged to T4 N1.  She complains of cough which has gotten slightly worse.  She has occasional blood clots coming up when she clots.  She does have occasional nausea.  REVIEW OF SYSTEMS:  Review of Systems  Respiratory: Positive for cough and hemoptysis.   Gastrointestinal: Positive for nausea.  All other systems reviewed and are negative.    PAST MEDICAL/SURGICAL HISTORY:  Past Medical History:  Diagnosis Date  . Anemia   . Aortic insufficiency   . Asthma   . Breast nodule   . Carotid artery disease ([place])   . 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 CKD (chronic kidney disease) stage 4, GFR 15-29 ml/min (HCC)   . COPD (chronic obstructive pulmonary disease) ([place])   . CRI (chronic renal insufficiency)   . Folic acid deficiency   . Glaucoma   . Graves disease   . Hyperlipidemia   . Hypertension   . Iron deficiency   . Lung cancer ([place])    non-small cell right lung  . Recurrent UTI   . Renal cyst   . Systemic vasculitis ([place])    Not otherwise specified, Dr. Sonny Dandy   Past Surgical History:  Procedure Laterality Date  . BRONCHOSCOPY    . COLONOSCOPY    . ORIF ORBITAL FRACTURE  1996   Left  . SPINAL FUSION  1994   At C3-5     SOCIAL HISTORY:  Social History   Socioeconomic History  . Marital  status: Married    Spouse name: Not on file  . Number of children: Not on file  . Years of education: Not on file  . Highest education level: Not on file  Occupational History  . Not on file  Social Needs  . Financial resource strain: Not on file  . Food insecurity:    Worry: Not on file    Inability: Not on file  . Transportation needs:    Medical: Not on file    Non-medical: Not on file  Tobacco Use  . Smoking status: Former Smoker    Years: 25.00    Types: Cigarettes    Last attempt to quit: 02/24/2008    Years since quitting: 9.4  . Smokeless tobacco: Never Used  Substance and Sexual Activity  . Alcohol use: Not Currently  . Drug use: No  . Sexual activity: Not on file  Lifestyle  . Physical activity:    Days per week: Not on file    Minutes per session: Not on file  . Stress: Not on file  Relationships  . Social connections:    Talks on phone: Not on file    Gets together: Not on file    Attends religious service: Not on file    Active member of club or organization: Not on file  Attends meetings of clubs or organizations: Not on file    Relationship status: Not on file  . Intimate partner violence:    Fear of current or ex partner: Not on file    Emotionally abused: Not on file    Physically abused: Not on file    Forced sexual activity: Not on file  Other Topics Concern  . Not on file  Social History Narrative   No regular exercise.    FAMILY HISTORY:  Family History  Problem Relation Age of Onset  . Heart attack Father   . Diverticulitis Mother   . Fibromyalgia Mother   . Hypertension Brother     CURRENT MEDICATIONS:  Outpatient Encounter Medications as of 07/30/2017  Medication Sig  . albuterol (PROAIR HFA) 108 (90 BASE) MCG/ACT inhaler Inhale into the lungs as needed.    Marland Kitchen amLODipine (NORVASC) 10 MG tablet Take 10 mg by mouth daily.  . calcitRIOL (ROCALTROL) 0.25 MCG capsule Take 1 capsule by mouth daily.  . ferrous sulfate 325 (65 FE) MG  tablet Take 325 mg by mouth daily.    . folic acid (FOLVITE) 1 MG tablet Take 1 mg by mouth daily.    . metoprolol tartrate (LOPRESSOR) 25 MG tablet Take 25 mg by mouth 2 (two) times daily.   Marland Kitchen NIFEdipine (PROCARDIA-XL/ADALAT CC) 60 MG 24 hr tablet Take by mouth.  . sodium bicarbonate 650 MG tablet Take 650 mg by mouth 2 (two) times daily.   . Travoprost (TRAVATAN OP) Apply 1 drop to eye at bedtime. Right eye  . warfarin (COUMADIN) 5 MG tablet Take 5 mg by mouth daily. MANAGED BY PMD  . WELCHOL 3.75 g PACK Take 1 packet by mouth daily.   No facility-administered encounter medications on file as of 07/30/2017.     ALLERGIES:  Allergies  Allergen Reactions  . Sulfonamide Derivatives     REACTION: rash     PHYSICAL EXAM:  ECOG Performance status: 1  Vitals:   07/30/17 1106  BP: (!) 149/64  Pulse: 75  Resp: 16  SpO2: 100%   Filed Weights   07/30/17 1106  Weight: 88 lb 6.4 oz (40.1 kg)    Physical Exam  Deferred. LABORATORY DATA:  I have reviewed the labs as listed.  CBC    Component Value Date/Time   WBC 7.3 06/09/2016 1354   RBC 3.01 (L) 06/09/2016 1354   HGB 10.3 (L) 06/09/2016 1354   HCT 29.9 (L) 06/09/2016 1354   PLT 378 06/09/2016 1354   MCV 99.3 06/09/2016 1354   MCH 34.2 (H) 06/09/2016 1354   MCHC 34.4 06/09/2016 1354   RDW 14.7 06/09/2016 1354   LYMPHSABS 1,679 06/09/2016 1354   MONOABS 584 06/09/2016 1354   EOSABS 73 06/09/2016 1354   BASOSABS 0 06/09/2016 1354   CMP Latest Ref Rng & Units 06/09/2016 01/07/2016 10/19/2015  Glucose 65 - 99 mg/dL 96 98 -  BUN 7 - 25 mg/dL 43(H) 41(H) 26(A)  Creatinine 0.50 - 1.05 mg/dL 3.56(H) 3.30(H) 2.2(A)  Sodium 135 - 146 mmol/L 126(L) 129(L) 125(A)  Potassium 3.5 - 5.3 mmol/L 4.7 6.0(H) 4.8  Chloride 98 - 110 mmol/L 90(L) 94(L) -  CO2 20 - 31 mmol/L 22 22 -  Calcium 8.6 - 10.4 mg/dL 9.4 9.2 -  Total Protein 6.1 - 8.1 g/dL 6.8 6.3 -  Total Bilirubin 0.2 - 1.2 mg/dL 0.4 0.4 -  Alkaline Phos 33 - 130 U/L 73 58 57    AST 10 - 35 U/L  24 46(H) 30  ALT 6 - 29 U/L _0 ASSESSMENT & PLAN:   Malignant neoplasm of bronchus and lung ([place]) 1.  Clinical stage III (T4N1) right lung poorly differentiated adenocarcinoma, EGFR negative: -Patient underwent a CT scan of the chest for work-up of kidney [facility] at [facility] and was found to have spiculated solid nodule in the posterior right lower lobe measuring 1.6 x 1.2 cm with fullness in the right hilum.  On 06/15/2011 she underwent EBUS guided biopsy.  7S lymph node was negative for malignancy.  Right hilar mass was positive for carcinoma.  Endobronchial biopsy showed poorly differentiated adenocarcinoma.  She also had endobronchial tumor debulked (75% obstructing reduced to 25% obstructing). BI had tumor 1.5 cm distal to right upper lobe takeoff.  Full EBUS staging shows no adenopathy at 11 L, 10 L, 4L or 4R.  Left mainstem and segments clear.  Right upper lobe normal.  - Her hemoptysis has improved since she stopped taking Coumadin.  She takes aspirin daily.  She is on Coumadin for vasculitis and strokes. - PET CT scan dated 07/05/2017 shows 1.5 cm medial right lower lobe nodule, and right perihilar mass with no evidence of distant metastasis.  We also talked about MRI of the brain without contrast dated 07/07/2017 which did not show any intracranial metastasis. -She saw Dr. Roxan Hockey for opinion regarding surgery.  Dr. Roxan Hockey did not think she would be a surgical candidate.  He also thought the tumor is encroaching the mediastinum. -She reports slight worsening of cough particularly when lying down with occasional hemoptysis.  I think the endobronchial tumor is starting to grow back again.  She will need to start her chemoradiation therapy rather urgently.  We talked about chemotherapy containing carboplatin and paclitaxel given once a week during radiation.  This will be followed by consolidation immunotherapy with Durvalumab.  We talked about the  side effects.  I have called Dr. Grandville Silos in [place] who kindly agreed to see this patient on Monday at 10:45 AM.  He may start with radiation to control further closing down of the endobronchial tumor.  2.  CKD: She has stage V CKD.  Hence she was being worked up for kidney transplant which is put on hold at this time.  She follows up with Dr. Elita Quick in [place].      Orders placed this encounter:  No orders of the defined types were placed in this encounter.     Derek Jack, MD [place] [phone]

## 2017-08-02 ENCOUNTER — Encounter (HOSPITAL_COMMUNITY): Payer: Self-pay | Admitting: Hematology

## 2017-08-02 NOTE — Progress Notes (Unsigned)
Patient's insurance not accepted in [place].  Sent new referral to [employer].

## 2017-08-02 NOTE — Progress Notes (Unsigned)
Appointment with Dr Dolores Frame [place]) on 08/02/17.  Dr. Raliegh Ip spoke with the practice.  Patient aware. Records faxed to [phone].

## 2017-08-04 MED ORDER — LIDOCAINE-PRILOCAINE 2.5-2.5 % EX CREA: TOPICAL_CREAM | CUTANEOUS | 3 refills | Status: DC

## 2017-08-04 MED ORDER — PROCHLORPERAZINE MALEATE 10 MG PO TABS: 10.0000 mg | ORAL_TABLET | Freq: Four times a day (QID) | ORAL | 2 refills | Status: DC | PRN

## 2017-08-04 NOTE — Patient Instructions (Addendum)
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 The goal of treatment is to control and hopefully cure your cancer. You will see the doctor regularly throughout treatment. We monitor your lab work prior to every treatment. The doctor monitors your response to treatment by the way you are feeling, your blood work, and scans periodically.  You will have wait times while you are here for your treatment.  It can take about 30 minutes to 1 hour for your blood work to result.  There is wait time while pharmacy mixes your medications.  Your treatment plan will consist of Carboplatin and Paclitaxel while undergoing radiation. Once radiation treatment is complete, you will begin treatment with Durvalumab/Imfinzi. Imfinzi is given once every 14 days and take 60 minutes to infuse.    You will have the following premedications prior to receiving chemotherapy:  Premeds: Benadryl:  Help prevent an allergic-like reaction to the chemotherapy.  Pepcid: antihistamine - helps prevent an allergic-like reaction to the chemo. Aloxi - high powered nausea/vomiting prevention medication used for chemotherapy patients.  Dexamethasone - steroid - given to reduce the risk of you having an allergic type reaction to the chemotherapy. Dex can cause you to feel energized, nervous/anxious/jittery, make you have trouble sleeping, and/or make you feel hot/flushed in the face/neck and/or look pink/red in the face/neck. These side effects will pass as the Dex wears off. (takes 20 minutes to infuse)  These premeds will be given prior to Carboplatin and Paclitaxel but will not be given prior to Durvalumab/Imfinzi.   POTENTIAL SIDE EFFECTS OF TREATMENT:  Carboplatin   About This Drug Carboplatin is a drug used to treat cancer. This drug is given in the vein (IV). This drug takes about 30 minutes to infuse.   Possible Side  Effects (More Common) . Nausea and throwing up (vomiting). These symptoms may happen within a few hours after your treatment and may last up to 24 hours. Medicines are available to stop or lessen these side effects. . Bone marrow depression. This is a decrease in the number of white blood cells, red blood cells, and platelets. This may raise your risk of infection, make you tired and weak (fatigue), and raise your risk of bleeding. . Soreness of the mouth and throat. You may have red areas, white patches, or sores that hurt. . This drug may affect how your kidneys work. Your kidney function will be checked as needed. . Electrolyte changes. Your blood will be checked for electrolyte changes as needed.  Side Effects (Less Common) . Hair loss. Some patients lose their hair on the scalp and body. You may notice your hair thinning seven to 14 days after getting this drug. . Effects on the nerves are called peripheral neuropathy. You may feel numbness, tingling, or pain in your hands and feet. It may be hard for you to button your clothes, open jars, or walk as usual. The effect on the nerves may get worse with more doses of the drug. These effects get better in some people after the drug is stopped but it does not get better in all people. . Loose bowel movements (diarrhea) that may last for several days . Decreased hearing or ringing in the ears . Changes in the way food and drinks taste . Changes in liver function. Your liver function will be checked as needed.  Allergic Reactions Serious allergic reactions including anaphylaxis are rare. While you are  getting this drug in your vein (IV), tell your nurse right away if you have any of these symptoms of an allergic reaction: . Trouble catching your breath . Feeling like your tongue or throat are swelling . Feeling your heart beat quickly or in a not normal way (palpitations) . Feeling dizzy or lightheaded . Flushing, itching, rash, and/or  hives  Treating Side Effects . Drink 6-8 cups of fluids each day unless your doctor has told you to limit your fluid intake due to some other health problem. A cup is 8 ounces of fluid. If you throw up or have loose bowel movements, you should drink more fluids so that you do not become dehydrated (lack water in the body from losing too much fluid). . Mouth care is very important. Your mouth care should consist of routine, gentle cleaning of your teeth or dentures and rinsing your mouth with a mixture of 1/2 teaspoon of salt in 8 ounces of water or  teaspoon of baking soda in 8 ounces of water. This should be done at least after each meal and at bedtime. . If you have mouth sores, avoid mouthwash that has alcohol. Avoid alcohol and smoking because they can bother your mouth and throat. . If you have numbness and tingling in your hands and feet, be careful when cooking, walking, and handling sharp objects and hot liquids. . Talk with your nurse about getting a wig before you lose your hair. Also, call the [place] at 800-ACS-2345 to find out information about the "Look Good, Feel Better" program close to where you live. It is a free program where women getting chemotherapy can learn about wigs, turbans and scarves as well as makeup techniques and skin and nail care.  Food and Drug Interactions There are no known interactions of carboplatin with food. This drug may interact with other medicines. Tell your doctor and pharmacist about all the medicines and dietary supplements (vitamins, minerals, herbs and others) that you are taking at this time. The safety and use of dietary supplements and alternative diets are often not known. Using these might affect your cancer or interfere with your treatment. Until more is known, you should not use dietary supplements or alternative diets without your doctor's help.  When to Call the Doctor Call your doctor or nurse right away if you have any of  these symptoms: . Fever of 100.5 F (38 C) or above; chills . Bleeding or bruising that is not normal . Wheezing or trouble breathing . Nausea that stops you from eating or drinking . Throwing up more than once a day . Rash or itching . Loose bowel movements (diarrhea) more than four times a day or diarrhea with weakness or feeling lightheaded . Call your doctor or nurse as soon as possible if any of these symptoms happen: . Numbness, tingling, decreased feeling or weakness in fingers, toes, arms, or legs . Change in hearing, ringing in the ears . Blurred vision or other changes in eyesight . Decreased urine . Yellowing of skin or eyes  Problems and Reproductive Concerns Sexual problems and reproduction concerns may happen. In both men and women, this drug may affect your ability to have children. This cannot be determined before your treatment. Talk with your doctor or nurse if you plan to have children. Ask for information on sperm or egg banking. In men, this drug may interfere with your ability to make sperm, but it should not change your ability to have sexual relations.  In women, menstrual bleeding may become irregular or stop while you are getting this drug. Do not assume that you cannot become pregnant if you do not have a menstrual period. Women may go through signs of menopause (change of life) like vaginal dryness or itching. Vaginal lubricants can be used to lessen vaginal dryness, itching, and pain during sexual relations.   Paclitaxel (Taxol)  Paclitaxel is a drug used to treat cancer. It is given in the vein (IV).  This will take 3 hours to infuse.  The first time this drug is given it will take longer to infuse because it is increased slowly to monitor for reactions.  The nurse will be in the room with you for the first 15 minutes.   Possible Side Effects . Hair loss. Hair loss is often temporary, although with certain medicine, hair loss can sometimes be permanent. Hair  loss may happen suddenly or gradually. If you lose hair, you may lose it from your head, face, armpits, pubic area, chest, and/or legs. You may also notice your hair getting thin. . Swelling of your legs, ankles and/or feet (edema) . Flushing . Nausea and throwing up (vomiting) . Loose bowel movements (diarrhea) . Bone marrow depression. This is a decrease in the number of white blood cells, red blood cells, and platelets. This may raise your risk of infection, make you tired and weak (fatigue), and raise your risk of bleeding. . Effects on the nerves are called peripheral neuropathy. You may feel numbness, tingling, or pain in your hands and feet. It may be hard for you to button your clothes, open jars, or walk as usual. The effect on the nerves may get worse with more doses of the drug. These effects get better in some people after the drug is stopped but it does not get better in all people. . Changes in your liver function . Bone, joint and muscle pain . Abnormal EKG . Allergic reaction: Allergic reactions, including anaphylaxis are rare but may happen in some patients. Signs of allergic reaction to this drug may be swelling of the face, feeling like your tongue or throat are swelling, trouble breathing, rash, itching, fever, chills, feeling dizzy, and/or feeling that your heart is beating in a fast or not normal way. If this happens, do not take another dose of this drug. You should get urgent medical treatment. . Infection . Changes in your kidney function. Note: Each of the side effects above was reported in 20% or greater of patients treated with paclitaxel. Not all possible side effects are included above.  Warnings and Precautions . Severe bone marrow depression   Side Effects . To help with hair loss, wash with a mild shampoo and avoid washing your hair every day. . Avoid rubbing your scalp, instead, pat your hair or scalp dry . Avoid coloring your hair . Limit your use of hair  spray, electric curlers, blow dryers, and curling irons. . To help with nausea and vomiting, eat small, frequent meals instead of three large meals a day. Choose foods and drinks that are at room temperature. Ask your nurse or doctor about other helpful tips and medicine that is available to help or stop lessen these symptoms. . If you get diarrhea, eat low-fiber foods that are high in protein and calories and avoid foods that can irritate your digestive tracts or lead to cramping. Ask your nurse or doctor about medicine that can lessen or stop your diarrhea. . Mouth care is very important.  Your mouth care should consist of routine, gentle cleaning of your teeth or dentures and rinsing your mouth with a mixture of 1/2 teaspoon of salt in 8 ounces of water or  teaspoon of baking soda in 8 ounces of water. This should be done at least after each meal and at bedtime. . If you have mouth sores, avoid mouthwash that has alcohol. Also avoid alcohol and smoking because they can bother your mouth and throat. . Drink plenty of fluids (a minimum of eight glasses per day is recommended). . Take your temperature as your doctor or nurse tells you, and whenever you feel like you may have a fever. . Talk to your doctor or nurse about precautions you can take to avoid infections and bleeding. . Be careful when cooking, walking, and handling sharp objects and hot liquids.  Food and Drug Interactions . There are no known interactions of paclitaxel with food. . This drug may interact with other medicines. Tell your doctor and pharmacist about all the medicines and dietary supplements (vitamins, minerals, herbs and others) that you are taking at this time. . The safety and use of dietary supplements and alternative diets are often not known. Using these might affect your cancer or interfere with your treatment. Until more is known, you should not use dietary supplements or alternative diets without your cancer doctor's  help.  When to Call the Doctor Call your doctor or nurse if you have any of the following symptoms and/or any new or unusual symptoms: . Fever of 100.5 F (38 C) or above . Chills . Redness, pain, warmth, or swelling at the IV site during the infusion . Signs of allergic reaction: swelling of the face, feeling like your tongue or throat are swelling, trouble breathing, rash, itching, fever, chills, feeling dizzy, and/or feeling that your heart is beating in a fast or not normal way . Feeling that your heart is beating in a fast or not normal way (palpitations) . Weight gain of 5 pounds in one week (fluid retention) . Decreased urine or very dark urine . Signs of liver problems: dark urine, pale bowel movements, bad stomach pain, feeling very tired and weak, unusual itching, or yellowing of the eyes or skin . Heavy menstrual period that lasts longer than normal . Easy bruising or bleeding . Nausea that stops you from eating or drinking, and/or that is not relieved by prescribed medicines. . Loose bowel movements (diarrhea) more than 4 times a day or diarrhea with weakness or lightheadedness . Pain in your mouth or throat that makes it hard to eat or drink . Lasting loss of appetite or rapid weight loss of five pounds in a week . Signs of peripheral neuropathy: numbness, tingling, or decreased feeling in fingers or toes; trouble walking or changes in the way you walk; or feeling clumsy when buttoning clothes, opening jars, or other routine activities . Joint and muscle pain that is not relieved by prescribed medicines . Extreme fatigue that interferes with normal activities . While you are getting this drug, please tell your nurse right away if you have any pain, redness, or swelling at the site of the IV infusion. . If you think you are pregnant.  Durvalumab/Imfinzi   IMFINZI is the first and only immunotherapy for people with unresectable (can't be removed with surgery) Stage 3 non-small  cell lung cancer whose disease has not progressed following concurrent platinum-based chemoradiation therapy (CRT). Immunotherapy is a type of cancer treatment that works with the  immune system to find and attack cancer. IMFINZI may also attack healthy cells. IMFINZI will be given as an IV (intravenous) infusion every 2 weeks. The infusion usually lasts an hour.  The most common side effects of IMFINZI include: Cough  Feeling tired  Inflammation in the lungs (pneumonitis)  Upper respiratory tract infections  Shortness of breath  Rash  IMFINZI may also cause serious side effects. They include:  Lung problems (pneumonitis). Signs and symptoms may include new or worsening cough, shortness of breath, and chest pain.  Liver problems (hepatitis). Signs and symptoms may include yellowing of your skin or the whites of your eyes, severe nausea or vomiting, pain on the right side of your stomach area (abdomen), drowsiness, dark urine (tea colored), bleeding or bruising more easily than normal, and feeling less hungry than usual.  Intestinal problems (colitis). Signs and symptoms may include diarrhea or more bowel movements than usual; stools that are black, tarry, sticky, or have blood or mucus; and severe stomach-area (abdomen) pain or tenderness.  Hormone gland problems (especially the thyroid, adrenals, pituitary, and pancreas). Signs and symptoms that your hormone glands are not working properly may include headaches that will not go away or unusual headaches; extreme tiredness; weight gain or weight loss; dizziness or fainting; feeling more hungry or thirsty than usual; hair loss; feeling cold; constipation; your voice gets deeper; urinating more often than usual; nausea or vomiting; stomach-area (abdomen) pain; and changes in mood or behavior, such as decreased sex drive, irritability, or forgetfulness.  Kidney problems, including nephritis and kidney failure. Signs of kidney problems may include  decrease in the amount of urine, blood in your urine, swelling of your ankles, and loss of appetite.  Skin problems. Signs may include rash, itching, and skin blistering.  Problems in other organs. Signs and symptoms may include neck stiffness; headache; confusion; fever; chest pain, shortness of breath, or irregular heartbeat (myocarditis); changes in mood or behavior; low red blood cells (anemia); excessive bleeding or bruising; muscle weakness or muscle pain; blurry vision, double vision, or other vision problems; and eye pain or redness.  Severe infections. Signs and symptoms may include fever, cough, frequent urination, pain when urinating, and flu-like symptoms.  Severe infusion reactions. Signs and symptoms may include chills or shaking, itching or rash, flushing, shortness of breath or wheezing, dizziness, fever, feeling like passing out, back or neck pain, and facial swelling.    SELF CARE ACTIVITIES WHILE ON CHEMOTHERAPY:  Hydration Increase your fluid intake 48 hours prior to treatment and drink at least 8 to 12 cups (64 ounces) of water/decaff beverages per day after treatment. You can still have your cup of coffee or soda but these beverages do not count as part of your 8 to 12 cups that you need to drink daily. No alcohol intake.  Medications Continue taking your normal prescription medication as prescribed.  If you start any new herbal or new supplements please let us know first to make sure it is safe.  Mouth Care Have teeth cleaned professionally before starting treatment. Keep dentures and partial plates clean. Use soft toothbrush and do not use mouthwashes that contain alcohol. Biotene is a good mouthwash that is available at most pharmacies or may be ordered by calling [phone]. Use warm salt water gargles (1 teaspoon salt per 1 quart warm water) before and after meals and at bedtime. Or you may rinse with 2 tablespoons of three-percent hydrogen peroxide mixed in  eight ounces of water. If you are still  having problems with your mouth or sores in your mouth please call the clinic. If you need dental work, please let the doctor know before you go for your appointment so that we can coordinate the best possible time for you in regards to your chemo regimen. You need to also let your dentist know that you are actively taking chemo. We may need to do labs prior to your dental appointment.  Skin Care Always use sunscreen that has not expired and with SPF (Sun Protection Factor) of 50 or higher. Wear hats to protect your head from the sun. Remember to use sunscreen on your hands, ears, face, & feet.  Use good moisturizing lotions such as udder cream, eucerin, or even Vaseline. Some chemotherapies can cause dry skin, color changes in your skin and nails.     Avoid long, hot showers or baths.  Use gentle, fragrance-free soaps and laundry detergent.  Use moisturizers, preferably creams or ointments rather than lotions because the thicker consistency is better at preventing skin dehydration. Apply the cream or ointment within 15 minutes of showering. Reapply moisturizer at night, and moisturize your hands every time after you wash them.  Hair Loss (if your doctor says your hair will fall out)   If your doctor says that your hair is likely to fall out, decide before you begin chemo whether you want to wear a wig. You may want to shop before treatment to match your hair color.  Hats, turbans, and scarves can also camouflage hair loss, although some people prefer to leave their heads uncovered. If you go bare-headed outdoors, be sure to use sunscreen on your scalp.  Cut your hair short. It eases the inconvenience of shedding lots of hair, but it also can reduce the emotional impact of watching your hair fall out.  Don't perm or color your hair during chemotherapy. Those chemical treatments are already damaging to hair and can enhance hair loss. Once your chemo  treatments are done and your hair has grown back, it's OK to resume dyeing or perming hair. With chemotherapy, hair loss is almost always temporary. But when it grows back, it may be a different color or texture. In older adults who still had hair color before chemotherapy, the new growth may be completely gray.  Often, new hair is very fine and soft.  Infection Prevention Please wash your hands for at least 30 seconds using warm soapy water. Handwashing is the #1 way to prevent the spread of germs. Stay away from sick people or people who are getting over a cold. If you develop respiratory systems such as green/yellow mucus production or productive cough or persistent cough let us know and we will see if you need an antibiotic. It is a good idea to keep a pair of gloves on when going into grocery stores/[employer] to decrease your risk of coming into contact with germs on the carts, etc. Carry alcohol hand gel with you at all times and use it frequently if out in public. If your temperature reaches 100.5 or higher please call the clinic and let us know.  If it is after hours or on the weekend please go to the ER if your temperature is over 100.5.  Please have your own personal thermometer at home to use.    Sex and bodily fluids If you are going to have sex, a condom must be used to protect the person that isn't taking chemotherapy. Chemo can decrease your libido (sex drive). For a few  days after chemotherapy, chemotherapy can be excreted through your bodily fluids.  When using the toilet please close the lid and flush the toilet twice.  Do this for a few day after you have had chemotherapy.   Effects of chemotherapy on your sex life Some changes are simple and won't last long. They won't affect your sex life permanently. Sometimes you may feel:  too tired  not strong enough to be very active  sick or sore   not in the mood  anxious or low Your anxiety might not seem related to sex. For  example, you may be worried about the cancer and how your treatment is going. Or you may be worried about money, or about how you family are coping with your illness. These things can cause stress, which can affect your interest in sex. It's important to talk to your partner about how you feel. Remember - the changes to your sex life don't usually last long. There's usually no medical reason to stop having sex during chemo. The drugs won't have any long term physical effects on your performance or enjoyment of sex. Cancer can't be passed on to your partner during sex  Contraception It's important to use reliable contraception during treatment. Avoid getting pregnant while you or your partner are having chemotherapy. This isbecause the drugs may harm the baby. Sometimes chemotherapy drugs can leave a man or woman infertile.  This means you would not be able to have children in the future. You might want to talk to someone about permanent infertility. It can be very difficult to learn that you may no longer be able to have children. Some people find counselling helpful. There might be ways to preserve your fertility, although this is easier for men than for women. You may want to speak to a fertility expert. You can talk about sperm banking or harvesting your eggs. You can also ask about other fertility options, such as donor eggs. If you haveor have had breast cancer, your doctor might advise you not to take the contraceptive pill. This is because the hormones in it might affect the cancer. It is not known for sure whether or not chemotherapy drugs can be passed on through semen or secretions from the vagina. Because of this some doctors advise people to use a barrier method if you have sex during treatment. This applies to vaginal, anal or oral sex. Generally, doctors advise a barrier method only for the time you are actually having the treatment and for about a week after your treatment. Advice like  this can be worrying, but this does not mean that you have to avoid being intimate with your partner. You can still have close contact with your partner and continue to enjoy sex.     Animals If you have cats or birds we just ask that you not change the litter or change the cage.  Please have someone else do this for you while you are on chemotherapy.   Food Safety During and After Cancer Treatment Food safety is important for people both during and after cancer treatment. Cancer and cancer treatments, such as chemotherapy, radiation therapy, and stem cell/bone marrow transplantation, often weaken the immune system. This makes it harder for your body to protect itself from foodborne illness, also called food poisoning. Foodborne illness is caused by eating food that contains harmful bacteria, parasites, or viruses.  Foods to avoid Some foods have a higher risk of becoming tainted with bacteria. These include:  Unwashed fresh fruit and vegetables, especially leafy vegetables that can hide dirt and other contaminants  Raw sprouts, such as alfalfa sprouts  Raw or undercooked beef, especially ground beef, or other raw or undercooked meat and poultry  Fatty, fried, or spicy foods immediately before or after treatment.  These can sit heavy on your stomach and make you feel nauseous.  Raw or undercooked shellfish, such as oysters.  Sushi and sashimi, which often contain raw fish.   Unpasteurized beverages, such as unpasteurized fruit juices, raw milk, raw yogurt, or cider  Undercooked eggs, such as soft boiled, over easy, and poached; raw, unpasteurized eggs; or foods made with raw egg, such as homemade raw cookie dough and homemade mayonnaise   Simple steps for food safety Shop smart.  Do not buy food stored or displayed in an unclean area.  Do not buy bruised or damaged fruits or vegetables.  Do not buy cans that have cracks, dents, or bulges.  Pick up foods that can spoil at  the end of your shopping trip and store them in a cooler on the way home. Prepare and clean up foods carefully.  Rinse all fresh fruits and vegetables under running water, and dry them with a clean towel or paper towel.  Clean the top of cans before opening them.  After preparing food, wash your hands for 20 seconds with hot water and soap. Pay special attention to areas between fingers and under nails.  Clean your utensils and dishes with hot water and soap.  Disinfect your kitchen and cutting boards using 1 teaspoon of liquid, unscented bleach mixed into 1 quart of water.  Dispose of old food.  Eat canned and packaged food before its expiration date (the "use by" or "best before" date).  Consume refrigerated leftovers within 3 to 4 days. After that time, throw out the food. Even if the food does not smell or look spoiled, it still may be unsafe. Some bacteria, such as Listeria, can grow even on foods stored in the refrigerator if they are kept for too long. Take precautions when eating out.  At restaurants, avoid buffets and salad bars where food sits out for a long time and comes in contact with many people. Food can become contaminated when someone with a virus, often a norovirus, or another "bug" handles it.  Put any leftover food in a "to-go" container yourself, rather than having the server do it. And, refrigerate leftovers as soon as you get home.  Choose restaurants that are clean and that are willing to prepare your food as you order it cooked.    MEDICATIONS:                                           Compazine/Prochlorperazine 47m tablet. Take 1 tablet every 6 hours as needed for nausea/vomiting. (this can make you sleepy)  EMLA cream. Apply a quarter size amount to port site 1 hour prior to chemo. Do not rub in. Cover with plastic wrap.   Over-the-Counter Meds:  Colace 1096mcapsule. Take 2 capsules once a day for constipation. You may increase this dose to 2  tablets in the morning and 2 tablets at night for a total of 4 capsules a day. If this does not relieve your constipation, please [facility] can call a medication in to your pharmacy to help your  bowels move.    Imodium 514m capsule. Take 2 capsules after the 1st loose stool and then 1 capsule after every loose stool but do not exceed 8 capsules in a 24 hour period. [place] the [place]. If diarrhea occurs @ bedtime, take 2 capsules @ bedtime. Then take 2 capsules every 4 hours until morning. Call [place]    Diarrhea Sheet  If you are having loose stools/diarrhea, please purchase Imodium and begin taking as outlined:  At the first sign of loose stools you should begin taking Imodium. Take two capsules (476m after 1st loose stool. Then take 1 capsule after each loose stool but do not exceed 8 capsules in a 24 hour period. During the night, take two capsules (14m10mat bedtime and continue every 4 hours during the night until the morning. Call the [place] it is the weekend, go to the ER.  Always call the [place] you are having loose stools/diarrhea that you can't get under control.  Loose stools/diarrhea leads to dehydration (loss of water) in your body.  We have other options of trying to get the loose stools/diarrhea to stopped but you must let us [place]!   Constipation Sheet  Colace 100m58mpsule. Take 2 capsules once a day for constipation. You may increase this dose to 2 tablets in the morning and 2 tablets at night for a total of 4 capsules a day. If this does not relieve your constipation, please call [facility]we can call a medication in to your pharmacy to help your bowels move.  Do not go more than 2 days without a bowel movement.  It is very important that you do not become constipated.  It will make you feel sick to your stomach (nausea) and can cause abdominal pain and vomiting.   Nausea  Sheet   Compazine/Prochlorperazine 10mg28mlet. Take 1 tablet every 6 hours as needed for nausea/vomiting. (can make you sleepy)  Please call the [place] amount of nausea that you are experiencing.  If you begin to vomit, you need to call the [place] the [place] and you have vomited more than one time and can't get it to stop - go to the Emergency Room.  Persistent nausea/vomiting can lead to dehydration (loss of fluid in your body) and will make you feel terrible.   Ice chips, sips of clear liquids, foods that are @ room temperature, crackers, and toast tend to be better tolerated.     SYMPTOMS TO REPORT AS SOON AS POSSIBLE AFTER TREATMENT:  FEVER GREATER THAN 100.5 F  CHILLS WITH OR WITHOUT FEVER  NAUSEA AND VOMITING THAT IS NOT CONTROLLED WITH YOUR NAUSEA MEDICATION  UNUSUAL SHORTNESS OF BREATH  UNUSUAL BRUISING OR BLEEDING  TENDERNESS IN MOUTH AND THROAT WITH OR WITHOUT PRESENCE OF ULCERS  URINARY PROBLEMS  BOWEL PROBLEMS  UNUSUAL RASH      Wear comfortable clothing and clothing appropriate for easy access to any Portacath or PICC line. Let us [place] if there is anything that we can do to make your therapy better!    What to do if you need assistance after hours or on the weekends: CALL [phone] on the line, do not hang up.  You will hear multiple messages but at the end you will be connected with a nurse triage line.  They will contact the doctor if necessary.  Most of  the time they will be able to assist you.  Do not call the hospital operator.

## 2017-08-05 ENCOUNTER — Inpatient Hospital Stay (HOSPITAL_COMMUNITY): Payer: Managed Care, Other (non HMO)

## 2017-08-05 NOTE — Patient Instructions (Signed)
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 The goal of treatment is to control and hopefully cure your cancer. You will see the doctor regularly throughout treatment. We monitor your lab work prior to every treatment. The doctor monitors your response to treatment by the way you are feeling, your blood work, and scans periodically.  You will have wait times while you are here for your treatment.  It can take about 30 minutes to 1 hour for your blood work to result.  There is wait time while pharmacy mixes your medications.  Your treatment plan will consist of Carboplatin and Paclitaxel while undergoing radiation. Once radiation treatment is complete, you will begin treatment with Durvalumab/Imfinzi. Imfinzi is given once every 14 days and take 60 minutes to infuse.    You will have the following premedications prior to receiving chemotherapy:  Premeds: Benadryl:  Help prevent an allergic-like reaction to the chemotherapy.  Pepcid: antihistamine - helps prevent an allergic-like reaction to the chemo. Aloxi - high powered nausea/vomiting prevention medication used for chemotherapy patients.  Dexamethasone - steroid - given to reduce the risk of you having an allergic type reaction to the chemotherapy. Dex can cause you to feel energized, nervous/anxious/jittery, make you have trouble sleeping, and/or make you feel hot/flushed in the face/neck and/or look pink/red in the face/neck. These side effects will pass as the Dex wears off. (takes 20 minutes to infuse)  These premeds will be given prior to Carboplatin and Paclitaxel but will not be given prior to Durvalumab/Imfinzi.   POTENTIAL SIDE EFFECTS OF TREATMENT:  Carboplatin   About This Drug Carboplatin is a drug used to treat cancer. This drug is given in the vein (IV). This drug takes about 30 minutes to infuse.   Possible Side  Effects (More Common) . Nausea and throwing up (vomiting). These symptoms may happen within a few hours after your treatment and may last up to 24 hours. Medicines are available to stop or lessen these side effects. . Bone marrow depression. This is a decrease in the number of white blood cells, red blood cells, and platelets. This may raise your risk of infection, make you tired and weak (fatigue), and raise your risk of bleeding. . Soreness of the mouth and throat. You may have red areas, white patches, or sores that hurt. . This drug may affect how your kidneys work. Your kidney function will be checked as needed. . Electrolyte changes. Your blood will be checked for electrolyte changes as needed.  Side Effects (Less Common) . Hair loss. Some patients lose their hair on the scalp and body. You may notice your hair thinning seven to 14 days after getting this drug. . Effects on the nerves are called peripheral neuropathy. You may feel numbness, tingling, or pain in your hands and feet. It may be hard for you to button your clothes, open jars, or walk as usual. The effect on the nerves may get worse with more doses of the drug. These effects get better in some people after the drug is stopped but it does not get better in all people. . Loose bowel movements (diarrhea) that may last for several days . Decreased hearing or ringing in the ears . Changes in the way food and drinks taste . Changes in liver function. Your liver function will be checked as needed.  Allergic Reactions Serious allergic reactions including anaphylaxis are rare. While you are  getting this drug in your vein (IV), tell your nurse right away if you have any of these symptoms of an allergic reaction: . Trouble catching your breath . Feeling like your tongue or throat are swelling . Feeling your heart beat quickly or in a not normal way (palpitations) . Feeling dizzy or lightheaded . Flushing, itching, rash, and/or  hives  Treating Side Effects . Drink 6-8 cups of fluids each day unless your doctor has told you to limit your fluid intake due to some other health problem. A cup is 8 ounces of fluid. If you throw up or have loose bowel movements, you should drink more fluids so that you do not become dehydrated (lack water in the body from losing too much fluid). . Mouth care is very important. Your mouth care should consist of routine, gentle cleaning of your teeth or dentures and rinsing your mouth with a mixture of 1/2 teaspoon of salt in 8 ounces of water or  teaspoon of baking soda in 8 ounces of water. This should be done at least after each meal and at bedtime. . If you have mouth sores, avoid mouthwash that has alcohol. Avoid alcohol and smoking because they can bother your mouth and throat. . If you have numbness and tingling in your hands and feet, be careful when cooking, walking, and handling sharp objects and hot liquids. . Talk with your nurse about getting a wig before you lose your hair. Also, call the [employer] at 800-ACS-2345 to find out information about the "Look Good, Feel Better" program close to where you live. It is a free program where women getting chemotherapy can learn about wigs, turbans and scarves as well as makeup techniques and skin and nail care.  Food and Drug Interactions There are no known interactions of carboplatin with food. This drug may interact with other medicines. Tell your doctor and pharmacist about all the medicines and dietary supplements (vitamins, minerals, herbs and others) that you are taking at this time. The safety and use of dietary supplements and alternative diets are often not known. Using these might affect your cancer or interfere with your treatment. Until more is known, you should not use dietary supplements or alternative diets without your doctor's help.  When to Call the Doctor Call your doctor or nurse right away if you have any of  these symptoms: . Fever of 100.5 F (38 C) or above; chills . Bleeding or bruising that is not normal . Wheezing or trouble breathing . Nausea that stops you from eating or drinking . Throwing up more than once a day . Rash or itching . Loose bowel movements (diarrhea) more than four times a day or diarrhea with weakness or feeling lightheaded . Call your doctor or nurse as soon as possible if any of these symptoms happen: . Numbness, tingling, decreased feeling or weakness in fingers, toes, arms, or legs . Change in hearing, ringing in the ears . Blurred vision or other changes in eyesight . Decreased urine . Yellowing of skin or eyes  Problems and Reproductive Concerns Sexual problems and reproduction concerns may happen. In both men and women, this drug may affect your ability to have children. This cannot be determined before your treatment. Talk with your doctor or nurse if you plan to have children. Ask for information on sperm or egg banking. In men, this drug may interfere with your ability to make sperm, but it should not change your ability to have sexual relations.  In women, menstrual bleeding may become irregular or stop while you are getting this drug. Do not assume that you cannot become pregnant if you do not have a menstrual period. Women may go through signs of menopause (change of life) like vaginal dryness or itching. Vaginal lubricants can be used to lessen vaginal dryness, itching, and pain during sexual relations.   Paclitaxel (Taxol)  Paclitaxel is a drug used to treat cancer. It is given in the vein (IV).  This will take 3 hours to infuse.  The first time this drug is given it will take longer to infuse because it is increased slowly to monitor for reactions.  The nurse will be in the room with you for the first 15 minutes.   Possible Side Effects . Hair loss. Hair loss is often temporary, although with certain medicine, hair loss can sometimes be permanent. Hair  loss may happen suddenly or gradually. If you lose hair, you may lose it from your head, face, armpits, pubic area, chest, and/or legs. You may also notice your hair getting thin. . Swelling of your legs, ankles and/or feet (edema) . Flushing . Nausea and throwing up (vomiting) . Loose bowel movements (diarrhea) . Bone marrow depression. This is a decrease in the number of white blood cells, red blood cells, and platelets. This may raise your risk of infection, make you tired and weak (fatigue), and raise your risk of bleeding. . Effects on the nerves are called peripheral neuropathy. You may feel numbness, tingling, or pain in your hands and feet. It may be hard for you to button your clothes, open jars, or walk as usual. The effect on the nerves may get worse with more doses of the drug. These effects get better in some people after the drug is stopped but it does not get better in all people. . Changes in your liver function . Bone, joint and muscle pain . Abnormal EKG . Allergic reaction: Allergic reactions, including anaphylaxis are rare but may happen in some patients. Signs of allergic reaction to this drug may be swelling of the face, feeling like your tongue or throat are swelling, trouble breathing, rash, itching, fever, chills, feeling dizzy, and/or feeling that your heart is beating in a fast or not normal way. If this happens, do not take another dose of this drug. You should get urgent medical treatment. . Infection . Changes in your kidney function. Note: Each of the side effects above was reported in 20% or greater of patients treated with paclitaxel. Not all possible side effects are included above.  Warnings and Precautions . Severe bone marrow depression   Side Effects . To help with hair loss, wash with a mild shampoo and avoid washing your hair every day. . Avoid rubbing your scalp, instead, pat your hair or scalp dry . Avoid coloring your hair . Limit your use of hair  spray, electric curlers, blow dryers, and curling irons. . To help with nausea and vomiting, eat small, frequent meals instead of three large meals a day. Choose foods and drinks that are at room temperature. Ask your nurse or doctor about other helpful tips and medicine that is available to help or stop lessen these symptoms. . If you get diarrhea, eat low-fiber foods that are high in protein and calories and avoid foods that can irritate your digestive tracts or lead to cramping. Ask your nurse or doctor about medicine that can lessen or stop your diarrhea. . Mouth care is very important.  Your mouth care should consist of routine, gentle cleaning of your teeth or dentures and rinsing your mouth with a mixture of 1/2 teaspoon of salt in 8 ounces of water or  teaspoon of baking soda in 8 ounces of water. This should be done at least after each meal and at bedtime. . If you have mouth sores, avoid mouthwash that has alcohol. Also avoid alcohol and smoking because they can bother your mouth and throat. . Drink plenty of fluids (a minimum of eight glasses per day is recommended). . Take your temperature as your doctor or nurse tells you, and whenever you feel like you may have a fever. . Talk to your doctor or nurse about precautions you can take to avoid infections and bleeding. . Be careful when cooking, walking, and handling sharp objects and hot liquids.  Food and Drug Interactions . There are no known interactions of paclitaxel with food. . This drug may interact with other medicines. Tell your doctor and pharmacist about all the medicines and dietary supplements (vitamins, minerals, herbs and others) that you are taking at this time. . The safety and use of dietary supplements and alternative diets are often not known. Using these might affect your cancer or interfere with your treatment. Until more is known, you should not use dietary supplements or alternative diets without your cancer doctor's  help.  When to Call the Doctor Call your doctor or nurse if you have any of the following symptoms and/or any new or unusual symptoms: . Fever of 100.5 F (38 C) or above . Chills . Redness, pain, warmth, or swelling at the IV site during the infusion . Signs of allergic reaction: swelling of the face, feeling like your tongue or throat are swelling, trouble breathing, rash, itching, fever, chills, feeling dizzy, and/or feeling that your heart is beating in a fast or not normal way . Feeling that your heart is beating in a fast or not normal way (palpitations) . Weight gain of 5 pounds in one week (fluid retention) . Decreased urine or very dark urine . Signs of liver problems: dark urine, pale bowel movements, bad stomach pain, feeling very tired and weak, unusual itching, or yellowing of the eyes or skin . Heavy menstrual period that lasts longer than normal . Easy bruising or bleeding . Nausea that stops you from eating or drinking, and/or that is not relieved by prescribed medicines. . Loose bowel movements (diarrhea) more than 4 times a day or diarrhea with weakness or lightheadedness . Pain in your mouth or throat that makes it hard to eat or drink . Lasting loss of appetite or rapid weight loss of five pounds in a week . Signs of peripheral neuropathy: numbness, tingling, or decreased feeling in fingers or toes; trouble walking or changes in the way you walk; or feeling clumsy when buttoning clothes, opening jars, or other routine activities . Joint and muscle pain that is not relieved by prescribed medicines . Extreme fatigue that interferes with normal activities . While you are getting this drug, please tell your nurse right away if you have any pain, redness, or swelling at the site of the IV infusion. . If you think you are pregnant.  Durvalumab/Imfinzi   IMFINZI is the first and only immunotherapy for people with unresectable (can't be removed with surgery) Stage 3 non-small  cell lung cancer whose disease has not progressed following concurrent platinum-based chemoradiation therapy (CRT). Immunotherapy is a type of cancer treatment that works with the  immune system to find and attack cancer. IMFINZI may also attack healthy cells. IMFINZI will be given as an IV (intravenous) infusion every 2 weeks. The infusion usually lasts an hour.  The most common side effects of IMFINZI include: Cough  Feeling tired  Inflammation in the lungs (pneumonitis)  Upper respiratory tract infections  Shortness of breath  Rash  IMFINZI may also cause serious side effects. They include:  Lung problems (pneumonitis). Signs and symptoms may include new or worsening cough, shortness of breath, and chest pain.  Liver problems (hepatitis). Signs and symptoms may include yellowing of your skin or the whites of your eyes, severe nausea or vomiting, pain on the right side of your stomach area (abdomen), drowsiness, dark urine (tea colored), bleeding or bruising more easily than normal, and feeling less hungry than usual.  Intestinal problems (colitis). Signs and symptoms may include diarrhea or more bowel movements than usual; stools that are black, tarry, sticky, or have blood or mucus; and severe stomach-area (abdomen) pain or tenderness.  Hormone gland problems (especially the thyroid, adrenals, pituitary, and pancreas). Signs and symptoms that your hormone glands are not working properly may include headaches that will not go away or unusual headaches; extreme tiredness; weight gain or weight loss; dizziness or fainting; feeling more hungry or thirsty than usual; hair loss; feeling cold; constipation; your voice gets deeper; urinating more often than usual; nausea or vomiting; stomach-area (abdomen) pain; and changes in mood or behavior, such as decreased sex drive, irritability, or forgetfulness.  Kidney problems, including nephritis and kidney failure. Signs of kidney problems may include  decrease in the amount of urine, blood in your urine, swelling of your ankles, and loss of appetite.  Skin problems. Signs may include rash, itching, and skin blistering.  Problems in other organs. Signs and symptoms may include neck stiffness; headache; confusion; fever; chest pain, shortness of breath, or irregular heartbeat (myocarditis); changes in mood or behavior; low red blood cells (anemia); excessive bleeding or bruising; muscle weakness or muscle pain; blurry vision, double vision, or other vision problems; and eye pain or redness.  Severe infections. Signs and symptoms may include fever, cough, frequent urination, pain when urinating, and flu-like symptoms.  Severe infusion reactions. Signs and symptoms may include chills or shaking, itching or rash, flushing, shortness of breath or wheezing, dizziness, fever, feeling like passing out, back or neck pain, and facial swelling.    SELF CARE ACTIVITIES WHILE ON CHEMOTHERAPY:  Hydration Increase your fluid intake 48 hours prior to treatment and drink at least 8 to 12 cups (64 ounces) of water/decaff beverages per day after treatment. You can still have your cup of coffee or soda but these beverages do not count as part of your 8 to 12 cups that you need to drink daily. No alcohol intake.  Medications Continue taking your normal prescription medication as prescribed.  If you start any new herbal or new supplements please let us know first to make sure it is safe.  Mouth Care Have teeth cleaned professionally before starting treatment. Keep dentures and partial plates clean. Use soft toothbrush and do not use mouthwashes that contain alcohol. Biotene is a good mouthwash that is available at most pharmacies or may be ordered by calling [phone]. Use warm salt water gargles (1 teaspoon salt per 1 quart warm water) before and after meals and at bedtime. Or you may rinse with 2 tablespoons of three-percent hydrogen peroxide mixed in  eight ounces of water. If you are still  having problems with your mouth or sores in your mouth please call the clinic. If you need dental work, please let the doctor know before you go for your appointment so that we can coordinate the best possible time for you in regards to your chemo regimen. You need to also let your dentist know that you are actively taking chemo. We may need to do labs prior to your dental appointment.  Skin Care Always use sunscreen that has not expired and with SPF (Sun Protection Factor) of 50 or higher. Wear hats to protect your head from the sun. Remember to use sunscreen on your hands, ears, face, & feet.  Use good moisturizing lotions such as udder cream, eucerin, or even Vaseline. Some chemotherapies can cause dry skin, color changes in your skin and nails.     Avoid long, hot showers or baths.  Use gentle, fragrance-free soaps and laundry detergent.  Use moisturizers, preferably creams or ointments rather than lotions because the thicker consistency is better at preventing skin dehydration. Apply the cream or ointment within 15 minutes of showering. Reapply moisturizer at night, and moisturize your hands every time after you wash them.  Hair Loss (if your doctor says your hair will fall out)   If your doctor says that your hair is likely to fall out, decide before you begin chemo whether you want to wear a wig. You may want to shop before treatment to match your hair color.  Hats, turbans, and scarves can also camouflage hair loss, although some people prefer to leave their heads uncovered. If you go bare-headed outdoors, be sure to use sunscreen on your scalp.  Cut your hair short. It eases the inconvenience of shedding lots of hair, but it also can reduce the emotional impact of watching your hair fall out.  Don't perm or color your hair during chemotherapy. Those chemical treatments are already damaging to hair and can enhance hair loss. Once your chemo  treatments are done and your hair has grown back, it's OK to resume dyeing or perming hair. With chemotherapy, hair loss is almost always temporary. But when it grows back, it may be a different color or texture. In older adults who still had hair color before chemotherapy, the new growth may be completely gray.  Often, new hair is very fine and soft.  Infection Prevention Please wash your hands for at least 30 seconds using warm soapy water. Handwashing is the #1 way to prevent the spread of germs. Stay away from sick people or people who are getting over a cold. If you develop respiratory systems such as green/yellow mucus production or productive cough or persistent cough let us know and we will see if you need an antibiotic. It is a good idea to keep a pair of gloves on when going into grocery stores/[employer] to decrease your risk of coming into contact with germs on the carts, etc. Carry alcohol hand gel with you at all times and use it frequently if out in public. If your temperature reaches 100.5 or higher please call the clinic and let us know.  If it is after hours or on the weekend please go to the ER if your temperature is over 100.5.  Please have your own personal thermometer at home to use.    Sex and bodily fluids If you are going to have sex, a condom must be used to protect the person that isn't taking chemotherapy. Chemo can decrease your libido (sex drive). For a few  days after chemotherapy, chemotherapy can be excreted through your bodily fluids.  When using the toilet please close the lid and flush the toilet twice.  Do this for a few day after you have had chemotherapy.   Effects of chemotherapy on your sex life Some changes are simple and won't last long. They won't affect your sex life permanently. Sometimes you may feel:  too tired  not strong enough to be very active  sick or sore   not in the mood  anxious or low Your anxiety might not seem related to sex. For  example, you may be worried about the cancer and how your treatment is going. Or you may be worried about money, or about how you family are coping with your illness. These things can cause stress, which can affect your interest in sex. It's important to talk to your partner about how you feel. Remember - the changes to your sex life don't usually last long. There's usually no medical reason to stop having sex during chemo. The drugs won't have any long term physical effects on your performance or enjoyment of sex. Cancer can't be passed on to your partner during sex  Contraception It's important to use reliable contraception during treatment. Avoid getting pregnant while you or your partner are having chemotherapy. This isbecause the drugs may harm the baby. Sometimes chemotherapy drugs can leave a man or woman infertile.  This means you would not be able to have children in the future. You might want to talk to someone about permanent infertility. It can be very difficult to learn that you may no longer be able to have children. Some people find counselling helpful. There might be ways to preserve your fertility, although this is easier for men than for women. You may want to speak to a fertility expert. You can talk about sperm banking or harvesting your eggs. You can also ask about other fertility options, such as donor eggs. If you haveor have had breast cancer, your doctor might advise you not to take the contraceptive pill. This is because the hormones in it might affect the cancer. It is not known for sure whether or not chemotherapy drugs can be passed on through semen or secretions from the vagina. Because of this some doctors advise people to use a barrier method if you have sex during treatment. This applies to vaginal, anal or oral sex. Generally, doctors advise a barrier method only for the time you are actually having the treatment and for about a week after your treatment. Advice like  this can be worrying, but this does not mean that you have to avoid being intimate with your partner. You can still have close contact with your partner and continue to enjoy sex.     Animals If you have cats or birds we just ask that you not change the litter or change the cage.  Please have someone else do this for you while you are on chemotherapy.   Food Safety During and After Cancer Treatment Food safety is important for people both during and after cancer treatment. Cancer and cancer treatments, such as chemotherapy, radiation therapy, and stem cell/bone marrow transplantation, often weaken the immune system. This makes it harder for your body to protect itself from foodborne illness, also called food poisoning. Foodborne illness is caused by eating food that contains harmful bacteria, parasites, or viruses.  Foods to avoid Some foods have a higher risk of becoming tainted with bacteria. These include:  Unwashed fresh fruit and vegetables, especially leafy vegetables that can hide dirt and other contaminants  Raw sprouts, such as alfalfa sprouts  Raw or undercooked beef, especially ground beef, or other raw or undercooked meat and poultry  Fatty, fried, or spicy foods immediately before or after treatment.  These can sit heavy on your stomach and make you feel nauseous.  Raw or undercooked shellfish, such as oysters.  Sushi and sashimi, which often contain raw fish.   Unpasteurized beverages, such as unpasteurized fruit juices, raw milk, raw yogurt, or cider  Undercooked eggs, such as soft boiled, over easy, and poached; raw, unpasteurized eggs; or foods made with raw egg, such as homemade raw cookie dough and homemade mayonnaise   Simple steps for food safety Shop smart.  Do not buy food stored or displayed in an unclean area.  Do not buy bruised or damaged fruits or vegetables.  Do not buy cans that have cracks, dents, or bulges.  Pick up foods that can spoil at  the end of your shopping trip and store them in a cooler on the way home. Prepare and clean up foods carefully.  Rinse all fresh fruits and vegetables under running water, and dry them with a clean towel or paper towel.  Clean the top of cans before opening them.  After preparing food, wash your hands for 20 seconds with hot water and soap. Pay special attention to areas between fingers and under nails.  Clean your utensils and dishes with hot water and soap.  Disinfect your kitchen and cutting boards using 1 teaspoon of liquid, unscented bleach mixed into 1 quart of water.  Dispose of old food.  Eat canned and packaged food before its expiration date (the "use by" or "best before" date).  Consume refrigerated leftovers within 3 to 4 days. After that time, throw out the food. Even if the food does not smell or look spoiled, it still may be unsafe. Some bacteria, such as Listeria, can grow even on foods stored in the refrigerator if they are kept for too long. Take precautions when eating out.  At restaurants, avoid buffets and salad bars where food sits out for a long time and comes in contact with many people. Food can become contaminated when someone with a virus, often a norovirus, or another "bug" handles it.  Put any leftover food in a "to-go" container yourself, rather than having the server do it. And, refrigerate leftovers as soon as you get home.  Choose restaurants that are clean and that are willing to prepare your food as you order it cooked.    MEDICATIONS:                                           Compazine/Prochlorperazine 104m tablet. Take 1 tablet every 6 hours as needed for nausea/vomiting. (this can make you sleepy)  EMLA cream. Apply a quarter size amount to port site 1 hour prior to chemo. Do not rub in. Cover with plastic wrap.   Over-the-Counter Meds:  Colace 1077mcapsule. Take 2 capsules once a day for constipation. You may increase this dose to 2  tablets in the morning and 2 tablets at night for a total of 4 capsules a day. If this does not relieve your constipation, please call [facility] we can call a medication in to your pharmacy to help your  bowels move.    Imodium 59m capsule. Take 2 capsules after the 1st loose stool and then 1 capsule after every loose stool but do not exceed 8 capsules in a 24 hour period. Call the CFrederickif loose stools continue. If diarrhea occurs @ bedtime, take 2 capsules @ bedtime. Then take 2 capsules every 4 hours until morning. Call [place]    Diarrhea Sheet  If you are having loose stools/diarrhea, please purchase Imodium and begin taking as outlined:  At the first sign of loose stools you should begin taking Imodium. Take two capsules (432m after 1st loose stool. Then take 1 capsule after each loose stool but do not exceed 8 capsules in a 24 hour period. During the night, take two capsules (34m51mat bedtime and continue every 4 hours during the night until the morning. Call the Can[place]minsterf it is the weekend, go to the ER.  Always call the [place] you are having loose stools/diarrhea that you can't get under control.  Loose stools/diarrhea leads to dehydration (loss of water) in your body.  We have other options of trying to get the loose stools/diarrhea to stopped but you must let us [place]ow!   Constipation Sheet  Colace 100m33mpsule. Take 2 capsules once a day for constipation. You may increase this dose to 2 tablets in the morning and 2 tablets at night for a total of 4 capsules a day. If this does not relieve your constipation, please call [facility] can call a medication in to your pharmacy to help your bowels move.  Do not go more than 2 days without a bowel movement.  It is very important that you do not become constipated.  It will make you feel sick to your stomach (nausea) and can cause abdominal pain and vomiting.   Nausea  Sheet   Compazine/Prochlorperazine 10mg59mlet. Take 1 tablet every 6 hours as needed for nausea/vomiting. (can make you sleepy)  Please call the [place] the amount of nausea that you are experiencing.  If you begin to vomit, you need to call the Cance[place]ffortif it is the weekend and you have vomited more than one time and can't get it to stop - go to the Emergency Room.  Persistent nausea/vomiting can lead to dehydration (loss of fluid in your body) and will make you feel terrible.   Ice chips, sips of clear liquids, foods that are @ room temperature, crackers, and toast tend to be better tolerated.     SYMPTOMS TO REPORT AS SOON AS POSSIBLE AFTER TREATMENT:  FEVER GREATER THAN 100.5 F  CHILLS WITH OR WITHOUT FEVER  NAUSEA AND VOMITING THAT IS NOT CONTROLLED WITH YOUR NAUSEA MEDICATION  UNUSUAL SHORTNESS OF BREATH  UNUSUAL BRUISING OR BLEEDING  TENDERNESS IN MOUTH AND THROAT WITH OR WITHOUT PRESENCE OF ULCERS  URINARY PROBLEMS  BOWEL PROBLEMS  UNUSUAL RASH      Wear comfortable clothing and clothing appropriate for easy access to any Portacath or PICC line. Let us knKorea if there is anything that we can do to make your therapy better!    What to do if you need assistance after hours or on the weekends: CALL [phone] on the line, do not hang up.  You will hear multiple messages but at the end you will be connected with a nurse triage line.  They will contact the doctor if necessary.  Most of  the time they will be able to assist you.  Do not call the hospital operator.

## 2017-08-06 ENCOUNTER — Other Ambulatory Visit (HOSPITAL_COMMUNITY): Payer: Self-pay | Admitting: Hematology

## 2017-08-06 NOTE — Progress Notes (Signed)
START ON PATHWAY REGIMEN - Non-Small Cell Lung     Administer weekly:     Paclitaxel      Carboplatin   **Always confirm dose/schedule in your pharmacy ordering system**  Patient Characteristics: Stage III - Unresectable, PS = 0, 1 AJCC T Category: T4 Current Disease Status: No Distant Mets or Local Recurrence AJCC N Category: N1 AJCC M Category: M0 AJCC 8 Stage Grouping: IIIA Performance Status: PS = 0, 1 Intent of Therapy: Curative Intent, Discussed with Patient

## 2017-08-13 ENCOUNTER — Ambulatory Visit (HOSPITAL_COMMUNITY): Payer: Managed Care, Other (non HMO) | Admitting: Oncology

## 2017-08-13 ENCOUNTER — Other Ambulatory Visit (HOSPITAL_COMMUNITY): Payer: Managed Care, Other (non HMO)

## 2017-08-13 ENCOUNTER — Ambulatory Visit (HOSPITAL_COMMUNITY): Payer: Managed Care, Other (non HMO)

## 2017-08-17 ENCOUNTER — Other Ambulatory Visit (HOSPITAL_COMMUNITY): Payer: Managed Care, Other (non HMO)

## 2017-08-17 ENCOUNTER — Ambulatory Visit (HOSPITAL_COMMUNITY): Payer: Managed Care, Other (non HMO)

## 2017-08-17 ENCOUNTER — Ambulatory Visit (HOSPITAL_COMMUNITY): Payer: Managed Care, Other (non HMO) | Admitting: Hematology

## 2017-08-18 ENCOUNTER — Encounter (HOSPITAL_COMMUNITY): Payer: Self-pay | Admitting: Hematology

## 2017-08-18 ENCOUNTER — Inpatient Hospital Stay (HOSPITAL_COMMUNITY): Payer: Managed Care, Other (non HMO)

## 2017-08-18 ENCOUNTER — Inpatient Hospital Stay (HOSPITAL_BASED_OUTPATIENT_CLINIC_OR_DEPARTMENT_OTHER): Payer: Managed Care, Other (non HMO) | Admitting: Hematology

## 2017-08-18 VITALS — BP 142/63 | HR 70 | Temp 98.6°F | Resp 16

## 2017-08-18 VITALS — BP 134/87 | HR 65 | Temp 98.4°F | Resp 16 | Wt 87.9 lb

## 2017-08-18 DIAGNOSIS — R079 Chest pain, unspecified: Secondary | ICD-10-CM | POA: Diagnosis not present

## 2017-08-18 DIAGNOSIS — Z5111 Encounter for antineoplastic chemotherapy: Secondary | ICD-10-CM | POA: Diagnosis not present

## 2017-08-18 DIAGNOSIS — N185 Chronic kidney disease, stage 5: Secondary | ICD-10-CM

## 2017-08-18 DIAGNOSIS — G8929 Other chronic pain: Secondary | ICD-10-CM

## 2017-08-18 DIAGNOSIS — C3401 Malignant neoplasm of right main bronchus: Secondary | ICD-10-CM

## 2017-08-18 DIAGNOSIS — M25511 Pain in right shoulder: Secondary | ICD-10-CM

## 2017-08-18 DIAGNOSIS — C3490 Malignant neoplasm of unspecified part of unspecified bronchus or lung: Secondary | ICD-10-CM

## 2017-08-18 DIAGNOSIS — D539 Nutritional anemia, unspecified: Secondary | ICD-10-CM

## 2017-08-18 LAB — CBC WITH DIFFERENTIAL/PLATELET
BASOS ABS: 0 10*3/uL (ref 0.0–0.1)
Basophils Relative: 0 %
Eosinophils Absolute: 0.3 10*3/uL (ref 0.0–0.7)
Eosinophils Relative: 4 %
HCT: 26.3 % — ABNORMAL LOW (ref 36.0–46.0)
HEMOGLOBIN: 9.1 g/dL — AB (ref 12.0–15.0)
LYMPHS ABS: 0.9 10*3/uL (ref 0.7–4.0)
LYMPHS PCT: 12 %
MCH: 35 pg — AB (ref 26.0–34.0)
MCHC: 34.6 g/dL (ref 30.0–36.0)
MCV: 101.2 fL — AB (ref 78.0–100.0)
Monocytes Absolute: 0.8 10*3/uL (ref 0.1–1.0)
Monocytes Relative: 10 %
NEUTROS ABS: 5.5 10*3/uL (ref 1.7–7.7)
NEUTROS PCT: 74 %
Platelets: 380 10*3/uL (ref 150–400)
RBC: 2.6 MIL/uL — AB (ref 3.87–5.11)
RDW: 12.5 % (ref 11.5–15.5)
WBC: 7.4 10*3/uL (ref 4.0–10.5)

## 2017-08-18 LAB — COMPREHENSIVE METABOLIC PANEL
ALK PHOS: 52 U/L (ref 38–126)
ALT: 11 U/L (ref 0–44)
AST: 20 U/L (ref 15–41)
Albumin: 3.6 g/dL (ref 3.5–5.0)
Anion gap: 15 (ref 5–15)
BUN: 66 mg/dL — ABNORMAL HIGH (ref 6–20)
CALCIUM: 9.2 mg/dL (ref 8.9–10.3)
CO2: 19 mmol/L — ABNORMAL LOW (ref 22–32)
Chloride: 90 mmol/L — ABNORMAL LOW (ref 98–111)
Creatinine, Ser: 4.65 mg/dL — ABNORMAL HIGH (ref 0.44–1.00)
GFR, EST AFRICAN AMERICAN: 11 mL/min — AB (ref >60)
GFR, EST NON AFRICAN AMERICAN: 10 mL/min — AB (ref >60)
Glucose, Bld: 117 mg/dL — ABNORMAL HIGH (ref 70–99)
Potassium: 4.4 mmol/L (ref 3.5–5.1)
Sodium: 124 mmol/L — ABNORMAL LOW (ref 135–145)
Total Bilirubin: 0.3 mg/dL (ref 0.3–1.2)
Total Protein: 7.1 g/dL (ref 6.5–8.1)

## 2017-08-18 LAB — IRON AND TIBC
Iron: 95 ug/dL (ref 28–170)
SATURATION RATIOS: 29 % (ref 10.4–31.8)
TIBC: 328 ug/dL (ref 250–450)
UIBC: 233 ug/dL

## 2017-08-18 LAB — FOLATE

## 2017-08-18 LAB — VITAMIN B12: Vitamin B-12: 539 pg/mL (ref 180–914)

## 2017-08-18 LAB — TSH: TSH: 2.56 u[IU]/mL (ref 0.350–4.500)

## 2017-08-18 LAB — FERRITIN: FERRITIN: 236 ng/mL (ref 11–307)

## 2017-08-18 MED ORDER — FAMOTIDINE IN NACL 20-0.9 MG/50ML-% IV SOLN
20.0000 mg | Freq: Once | INTRAVENOUS | Status: AC
  Administered 2017-08-18: 20 mg via INTRAVENOUS

## 2017-08-18 MED ORDER — TRAMADOL HCL 50 MG PO TABS: 50.0000 mg | ORAL_TABLET | Freq: Once | ORAL | 0 refills | Status: AC

## 2017-08-18 MED ORDER — PALONOSETRON HCL INJECTION 0.25 MG/5ML
INTRAVENOUS | Status: AC
  Filled 2017-08-18: qty 5

## 2017-08-18 MED ORDER — DIPHENHYDRAMINE HCL 50 MG/ML IJ SOLN
INTRAMUSCULAR | Status: AC
  Filled 2017-08-18: qty 1

## 2017-08-18 MED ORDER — PACLITAXEL CHEMO INJECTION 300 MG/50ML
45.0000 mg/m2 | Freq: Once | INTRAVENOUS | Status: AC
  Administered 2017-08-18: 60 mg via INTRAVENOUS
  Filled 2017-08-18: qty 10

## 2017-08-18 MED ORDER — SODIUM CHLORIDE 0.9 % IV SOLN
67.6000 mg | Freq: Once | INTRAVENOUS | Status: AC
  Administered 2017-08-18: 70 mg via INTRAVENOUS
  Filled 2017-08-18: qty 7

## 2017-08-18 MED ORDER — SODIUM CHLORIDE 0.9 % IV SOLN
Freq: Once | INTRAVENOUS | Status: AC
  Administered 2017-08-18: 11:00:00 via INTRAVENOUS

## 2017-08-18 MED ORDER — SODIUM CHLORIDE 0.9 % IV SOLN
20.0000 mg | Freq: Once | INTRAVENOUS | Status: AC
  Administered 2017-08-18: 20 mg via INTRAVENOUS
  Filled 2017-08-18: qty 2

## 2017-08-18 MED ORDER — PALONOSETRON HCL INJECTION 0.25 MG/5ML
0.2500 mg | Freq: Once | INTRAVENOUS | Status: AC
  Administered 2017-08-18: 0.25 mg via INTRAVENOUS

## 2017-08-18 MED ORDER — FAMOTIDINE IN NACL 20-0.9 MG/50ML-% IV SOLN
INTRAVENOUS | Status: AC
  Filled 2017-08-18: qty 50

## 2017-08-18 MED ORDER — DIPHENHYDRAMINE HCL 50 MG/ML IJ SOLN
50.0000 mg | Freq: Once | INTRAMUSCULAR | Status: AC
  Administered 2017-08-18: 50 mg via INTRAVENOUS

## 2017-08-18 NOTE — Assessment & Plan Note (Signed)
1.  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 and was found to have spiculated solid nodule in the posterior right lower lobe measuring 1.6 x 1.2 cm with fullness in the right hilum.  On 06/15/2011 she underwent EBUS guided biopsy.  7S lymph node was negative for malignancy.  Right hilar mass was positive for carcinoma.  Endobronchial biopsy showed poorly differentiated adenocarcinoma.  She also had endobronchial tumor debulked (75% obstructing reduced to 25% obstructing). BI had tumor 1.5 cm distal to right upper lobe takeoff.  Full EBUS staging shows no adenopathy at 11 L, 10 L, 4L or 4R.  Left mainstem and segments clear.  Right upper lobe normal.  - Her hemoptysis has improved since she stopped taking Coumadin.  She takes aspirin daily.  She is on Coumadin for vasculitis and strokes. - PET CT scan dated 07/05/2017 shows 1.5 cm medial right lower lobe nodule, and right perihilar mass with no evidence of distant metastasis.  We also talked about MRI of the brain without contrast dated 07/07/2017 which did not show any intracranial metastasis. -She saw Dr. Roxan Hockey for opinion regarding surgery.  Dr. Roxan Hockey did not think she would be a surgical candidate.  He also thought the tumor is encroaching the mediastinum. - Hemoptysis has improved over the last few days.  Complains of occasional vague chest pain which is nagging in the center of the chest.  She also complained of pain in her right shoulder and has difficulty putting it up when she is having radiation therapy.  She reportedly had neck surgery with resulting chronic right shoulder pain in certain positions.  I have given a prescription for tramadol 50 mg to be taken an hour before the radiation.  She tried taking ibuprofen, but we are concerned about her kidney function. -She started radiation therapy on  08/18/2017.  We will proceed with her first weekly dose of carboplatin and paclitaxel.  We will monitor her blood counts closely.  2.  CKD: She has stage V CKD.  Hence she was being worked up for kidney transplant which is put on hold at this time.  She follows up with Dr. Elita Quick in [place].

## 2017-08-18 NOTE — Progress Notes (Signed)
[place], [place] [address]   CLINIC:  Medical Oncology/Hematology  PCP:  Glenda Chroman, MD [address] [place] [address] [phone]   REASON FOR VISIT:  Follow-up for stage III lung cancer.  CURRENT THERAPY: Combination chemoradiation therapy.  BRIEF ONCOLOGIC HISTORY:    Malignant neoplasm of bronchus and lung ([place])   06/30/2017 Initial Diagnosis    Malignant neoplasm of bronchus and lung ([place])      08/15/2017 -  Chemotherapy    The patient had palonosetron (ALOXI) injection 0.25 mg, 0.25 mg, Intravenous,  Once, 1 of 4 cycles Administration: 0.25 mg (08/18/2017) CARBOplatin (PARAPLATIN) 70 mg in sodium chloride 0.9 % 100 mL chemo infusion, 70 mg (100 % of original dose 67.6 mg), Intravenous,  Once, 1 of 4 cycles Dose modification:   (original dose 67.6 mg, Cycle 1) PACLitaxel (TAXOL) 60 mg in sodium chloride 0.9 % 150 mL chemo infusion (</= 38m/m2), 45 mg/m2 = 60 mg, Intravenous,  Once, 1 of 4 cycles  for chemotherapy treatment.         CANCER STAGING: Cancer Staging No matching staging information was found for the patient.   INTERVAL HISTORY:  Ms. [age]rauner89y.[id number]. female returns for follow-up of stage III lung cancer.  She reportedly started radiation therapy today.  She complains of on and off chest pains.  Overall her hemoptysis has gotten better.  She complains of right shoulder pain when she lies on the radiation table with her upper extremity.  She is trying to take ibuprofen on a daily basis.  Denies any other pains.  MRI of the brain on 07/07/2017 reviewed by me did not show any evidence of metastatic disease.   REVIEW OF SYSTEMS:  Review of Systems  Respiratory: Positive for cough and hemoptysis.   Cardiovascular: Positive for chest pain.  Musculoskeletal:       Complains of right shoulder pain in particular positions.  All other systems reviewed and are negative.    PAST MEDICAL/SURGICAL HISTORY:  Past Medical  History:  Diagnosis Date  . Anemia   . Aortic insufficiency   . Asthma   . Breast nodule   . Carotid artery disease ([facility] [place]   . 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 CKD (chronic kidney disease) stage 4, GFR 15-29 ml/min (HCC)   . COPD (chronic obstructive pulmonary disease) ([place]   . CRI (chronic renal insufficiency)   . Folic acid deficiency   . Glaucoma   . Graves disease   . Hyperlipidemia   . Hypertension   . Iron deficiency   . Lung cancer ([facility]    non-small cell right lung  . Recurrent UTI   . Renal cyst   . Systemic vasculitis ([facility]    Not otherwise specified, Dr. KSonny Dandy  Past Surgical History:  Procedure Laterality Date  . BRONCHOSCOPY    . COLONOSCOPY    . ORIF ORBITAL FRACTURE  1996   Left  . SPINAL FUSION  1994   At C3-5     SOCIAL HISTORY:  Social History   Socioeconomic History  . Marital status: Married    Spouse name: Not on file  . Number of children: Not on file  . Years of education: Not on file  . Highest education level: Not on file  Occupational History  . Not on file  Social Needs  . Financial resource strain: Not on file  . Food insecurity:  Worry: Not on file    Inability: Not on file  . Transportation needs:    Medical: Not on file    Non-medical: Not on file  Tobacco Use  . Smoking status: Former Smoker    Years: 25.00    Types: Cigarettes    Last attempt to quit: 02/24/2008    Years since quitting: 9.4  . Smokeless tobacco: Never Used  Substance and Sexual Activity  . Alcohol use: Not Currently  . Drug use: No  . Sexual activity: Not on file  Lifestyle  . Physical activity:    Days per week: Not on file    Minutes per session: Not on file  . Stress: Not on file  Relationships  . Social connections:    Talks on phone: Not on file    Gets together: Not on file    Attends religious service: Not on file    Active member of club or organization: Not on file    Attends meetings of  clubs or organizations: Not on file    Relationship status: Not on file  . Intimate partner violence:    Fear of current or ex partner: Not on file    Emotionally abused: Not on file    Physically abused: Not on file    Forced sexual activity: Not on file  Other Topics Concern  . Not on file  Social History Narrative   No regular exercise.    FAMILY HISTORY:  Family History  Problem Relation Age of Onset  . Heart attack Father   . Diverticulitis Mother   . Fibromyalgia Mother   . Hypertension Brother     CURRENT MEDICATIONS:  Outpatient Encounter Medications as of 08/18/2017  Medication Sig  . albuterol (PROAIR HFA) 108 (90 BASE) MCG/ACT inhaler Inhale into the lungs as needed.    Marland Kitchen amLODipine (NORVASC) 10 MG tablet Take 10 mg by mouth daily.  . calcitRIOL (ROCALTROL) 0.25 MCG capsule Take 1 capsule by mouth daily.  . ferrous sulfate 325 (65 FE) MG tablet Take 325 mg by mouth daily.    . folic acid (FOLVITE) 1 MG tablet Take 1 mg by mouth daily.    Marland Kitchen lidocaine-prilocaine (EMLA) cream Apply a quarter size amount to port site 1 hour prior to chemo. Do not rub in. Cover with plastic wrap.  . metoprolol tartrate (LOPRESSOR) 25 MG tablet Take 25 mg by mouth 2 (two) times daily.   Marland Kitchen NIFEdipine (PROCARDIA-XL/ADALAT CC) 60 MG 24 hr tablet Take by mouth.  . prochlorperazine (COMPAZINE) 10 MG tablet Take 1 tablet (10 mg total) by mouth every 6 (six) hours as needed for nausea or vomiting.  . sodium bicarbonate 650 MG tablet Take 650 mg by mouth 2 (two) times daily.   . Travoprost (TRAVATAN OP) Apply 1 drop to eye at bedtime. Right eye  . warfarin (COUMADIN) 5 MG tablet Take 5 mg by mouth daily. MANAGED BY PMD  . WELCHOL 3.75 g PACK Take 1 packet by mouth daily.  . traMADol (ULTRAM) 50 MG tablet Take 1 tablet (50 mg total) by mouth once for 1 dose. Take 1 tablet 60 minutes prior to radiation therapy.   No facility-administered encounter medications on file as of 08/18/2017.      ALLERGIES:  Allergies  Allergen Reactions  . Sulfonamide Derivatives     REACTION: rash     PHYSICAL EXAM:  ECOG Performance status: 1  Vitals:   08/18/17 0932  BP: 134/87  Pulse: 65  Resp:  16  Temp: 98.4 F (36.9 C)  SpO2: 99%   Filed Weights   08/18/17 0932  Weight: 87 lb 14.4 oz (39.9 kg)    Physical Exam   LABORATORY DATA:  I have reviewed the labs as listed.  CBC    Component Value Date/Time   WBC 7.4 08/18/2017 0831   RBC 2.60 (L) 08/18/2017 0831   HGB 9.1 (L) 08/18/2017 0831   HCT 26.3 (L) 08/18/2017 0831   PLT 380 08/18/2017 0831   MCV 101.2 (H) 08/18/2017 0831   MCH 35.0 (H) 08/18/2017 0831   MCHC 34.6 08/18/2017 0831   RDW 12.5 08/18/2017 0831   LYMPHSABS 0.9 08/18/2017 0831   MONOABS 0.8 08/18/2017 0831   EOSABS 0.3 08/18/2017 0831   BASOSABS 0.0 08/18/2017 0831   CMP Latest Ref Rng & Units 08/18/2017 06/09/2016 01/07/2016  Glucose 70 - 99 mg/dL 117(H) 96 98  BUN 6 - 20 mg/dL 66(H) 43(H) 41(H)  Creatinine 0.44 - 1.00 mg/dL 4.65(H) 3.56(H) 3.30(H)  Sodium 135 - 145 mmol/L 124(L) 126(L) 129(L)  Potassium 3.5 - 5.1 mmol/L 4.4 4.7 6.0(H)  Chloride 98 - 111 mmol/L 90(L) 90(L) 94(L)  CO2 22 - 32 mmol/L 19(L) 22 22  Calcium 8.9 - 10.3 mg/dL 9.2 9.4 9.2  Total Protein 6.5 - 8.1 g/dL 7.1 6.8 6.3  Total Bilirubin 0.3 - 1.2 mg/dL 0.3 0.4 0.4  Alkaline Phos 38 - 126 U/L 52 73 58  AST 15 - 41 U/L 20 24 46(H)  ALT 0 - 44 U/L 11 9 28        DIAGNOSTIC IMAGING:  I have reviewed the MRI of the brain dated 07/07/2017 and agree with report.     ASSESSMENT & PLAN:   Malignant neoplasm of bronchus and lung ([place]) 1.  Clinical stage III (T4N1) right lung poorly differentiated adenocarcinoma, EGFR negative: -Patient underwent a CT scan of the chest for work-up of kidney transplant at [facility] and was found to have spiculated solid nodule in the posterior right lower lobe measuring 1.6 x 1.2 cm with fullness in the right hilum.  On 06/15/2011 she  underwent EBUS guided biopsy.  7S lymph node was negative for malignancy.  Right hilar mass was positive for carcinoma.  Endobronchial biopsy showed poorly differentiated adenocarcinoma.  She also had endobronchial tumor debulked (75% obstructing reduced to 25% obstructing). BI had tumor 1.5 cm distal to right upper lobe takeoff.  Full EBUS staging shows no adenopathy at 11 L, 10 L, 4L or 4R.  Left mainstem and segments clear.  Right upper lobe normal.  - Her hemoptysis has improved since she stopped taking Coumadin.  She takes aspirin daily.  She is on Coumadin for vasculitis and strokes. - PET CT scan dated 07/05/2017 shows 1.5 cm medial right lower lobe nodule, and right perihilar mass with no evidence of distant metastasis.  We also talked about MRI of the brain without contrast dated 07/07/2017 which did not show any intracranial metastasis. -She saw Dr. Roxan Hockey for opinion regarding surgery.  Dr. Roxan Hockey did not think she would be a surgical candidate.  He also thought the tumor is encroaching the mediastinum. - Hemoptysis has improved over the last few days.  Complains of occasional vague chest pain which is nagging in the center of the chest.  She also complained of pain in her right shoulder and has difficulty putting it up when she is having radiation therapy.  She reportedly had neck surgery with resulting chronic right shoulder pain in certain  positions.  I have given a prescription for tramadol 50 mg to be taken an hour before the radiation.  She tried taking ibuprofen, but we are concerned about her kidney function. -She started radiation therapy on 08/18/2017.  We will proceed with her first weekly dose of carboplatin and paclitaxel.  We will monitor her blood counts closely.  2.  CKD: She has stage V CKD.  Hence she was being worked up for kidney transplant which is put on hold at this time.  She follows up with Dr. Elita Quick in [place].      Orders placed this encounter:  Orders  Placed This Encounter  Procedures  . Ferritin  . Folate  . Iron and TIBC  . Vitamin B12      Derek Jack, [place] [facility] [phone]

## 2017-08-18 NOTE — Patient Instructions (Signed)
[facility] Discharge Instructions for Patients Receiving Chemotherapy   Beginning January 23rd 2017 lab work for the Cancer Center will be done in the  Main lab at [place] on 1st floor. If you have a lab appointment with the Cancer Center please come in thru the  Main Entrance and check in at the main information desk   Today you received the following chemotherapy agents   To help prevent nausea and vomiting after your treatment, we encourage you to take your nausea medication     If you develop nausea and vomiting, or diarrhea that is not controlled by your medication, call the clinic.  The clinic phone number is [phone]. Office hours are Monday-Friday 8:30am-5:00pm.  BELOW ARE SYMPTOMS THAT SHOULD BE REPORTED IMMEDIATELY:  *FEVER GREATER THAN 101.0 F  *CHILLS WITH OR WITHOUT FEVER  NAUSEA AND VOMITING THAT IS NOT CONTROLLED WITH YOUR NAUSEA MEDICATION  *UNUSUAL SHORTNESS OF BREATH  *UNUSUAL BRUISING OR BLEEDING  TENDERNESS IN MOUTH AND THROAT WITH OR WITHOUT PRESENCE OF ULCERS  *URINARY PROBLEMS  *BOWEL PROBLEMS  UNUSUAL RASH Items with * indicate a potential emergency and should be followed up as soon as possible. If you have an emergency after office hours please contact your primary care physician or go to the nearest emergency department.  Please call the clinic during office hours if you have any questions or concerns.   You may also contact the Patient Navigator at [phone] should you have any questions or need assistance in obtaining follow up care.      Resources For Cancer Patients and their Caregivers ? American Cancer Society: Can assist with transportation, wigs, general needs, runs Look Good Feel Better.        1-888-227-6333 ? Cancer Care: Provides financial assistance, online support groups, medication/co-pay assistance.  1-800-813-HOPE (4673) ? [facility] Assists [place] cancer  patients and their families through emotional , educational and financial support.  336-427-4357 ? [place] DSS Where to apply for food stamps, Medicaid and utility assistance. 336-342-1394 ? [facility]: Transportation to medical appointments. 336-347-2287 ? Social Security Administration: May apply for disability if have a Stage IV cancer. 336-342-7796 1-800-772-1213 ? [place] Aging, Disability and Transit Services: Assists with nutrition, care and transit needs. 336-349-2343         

## 2017-08-18 NOTE — Progress Notes (Signed)
Chemotherapy teaching done today. Consent done. Also set up patient to have port a cath placed.   Prior to starting chemotherapy today, peripheral iv checked for blood return by two RN's. Good blood return noted at this time.

## 2017-08-18 NOTE — Patient Instructions (Signed)
[facility] at [facility] Discharge Instructions  You saw Dr. Katragadda today.   Thank you for choosing [facility] at [facility] to provide your oncology and hematology care.  To afford each patient quality time with our provider, please arrive at least 15 minutes before your scheduled appointment time.   If you have a lab appointment with the Cancer Center please come in thru the  Main Entrance and check in at the main information desk  You need to re-schedule your appointment should you arrive 10 or more minutes late.  We strive to give you quality time with our providers, and arriving late affects you and other patients whose appointments are after yours.  Also, if you no show three or more times for appointments you may be dismissed from the clinic at the providers discretion.     Again, thank you for choosing [facility].  Our hope is that these requests will decrease the amount of time that you wait before being seen by our physicians.       _____________________________________________________________  Should you have questions after your visit to [facility], please contact our office at [phone] between the hours of 8:30 a.m. and 4:30 p.m.  Voicemails left after 4:30 p.m. will not be returned until the following business day.  For prescription refill requests, have your pharmacy contact our office.       Resources For Cancer Patients and their Caregivers ? American Cancer Society: Can assist with transportation, wigs, general needs, runs Look Good Feel Better.        1-888-227-6333 ? Cancer Care: Provides financial assistance, online support groups, medication/co-pay assistance.  1-800-813-HOPE (4673) ? [facility] Assists [place] cancer patients and their families through emotional , educational and financial support.  336-427-4357 ? [place] DSS Where to apply for  food stamps, Medicaid and utility assistance. 336-342-1394 ? [facility]: Transportation to medical appointments. 336-347-2287 ? Social Security Administration: May apply for disability if have a Stage IV cancer. 336-342-7796 1-800-772-1213 ? [place] Aging, Disability and Transit Services: Assists with nutrition, care and transit needs. 336-349-2343  Cancer Center Support Programs:   > Cancer Support Group  2nd Tuesday of the month 1pm-2pm, Journey Room   > Creative Journey  3rd Tuesday of the month 1130am-1pm, Journey Room     

## 2017-08-19 ENCOUNTER — Ambulatory Visit: Payer: Managed Care, Other (non HMO) | Admitting: General Surgery

## 2017-08-19 ENCOUNTER — Encounter: Payer: Self-pay | Admitting: General Surgery

## 2017-08-19 VITALS — BP 151/72 | HR 84 | Temp 98.0°F | Resp 16 | Wt 88.0 lb

## 2017-08-19 DIAGNOSIS — C3490 Malignant neoplasm of unspecified part of unspecified bronchus or lung: Secondary | ICD-10-CM

## 2017-08-19 NOTE — Progress Notes (Signed)
24 hour follow up- patient stated she is feeling good. Little nausea last night , took a compazine and felt better. No other issues.

## 2017-08-19 NOTE — Progress Notes (Signed)
Tasha [name]; [id number]; 24-Jun-1965   HPI Patient is a 52 year old white female who was referred to my care by Dr. Delton Coombes of oncology for Port-A-Cath placement.  She is undergoing chemotherapy and radiation therapy for right lung cancer.  She currently denies any pain.  She needs a Port-A-Cath placed for central venous access. Past Medical History:  Diagnosis Date  . Anemia   . Aortic insufficiency   . Asthma   . Breast nodule   . Carotid artery disease ([place])   . 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 CKD (chronic kidney disease) stage 4, GFR 15-29 ml/min (HCC)   . COPD (chronic obstructive pulmonary disease) ([place])   . CRI (chronic renal insufficiency)   . Folic acid deficiency   . Glaucoma   . Graves disease   . Hyperlipidemia   . Hypertension   . Iron deficiency   . Lung cancer ([place])    non-small cell right lung  . Recurrent UTI   . Renal cyst   . Systemic vasculitis ([place])    Not otherwise specified, Dr. Sonny Dandy    Past Surgical History:  Procedure Laterality Date  . BRONCHOSCOPY    . COLONOSCOPY    . ORIF ORBITAL FRACTURE  1996   Left  . SPINAL FUSION  1994   At C3-5    Family History  Problem Relation Age of Onset  . Heart attack Father   . Diverticulitis Mother   . Fibromyalgia Mother   . Hypertension Brother     Current Outpatient Medications on File Prior to Visit  Medication Sig Dispense Refill  . aspirin EC 81 MG tablet Take 81 mg by mouth daily.    Marland Kitchen albuterol (PROAIR HFA) 108 (90 BASE) MCG/ACT inhaler Inhale into the lungs as needed.      Marland Kitchen amLODipine (NORVASC) 10 MG tablet Take 10 mg by mouth daily.    . calcitRIOL (ROCALTROL) 0.25 MCG capsule Take 1 capsule by mouth daily.    . ferrous sulfate 325 (65 FE) MG tablet Take 325 mg by mouth daily.      . folic acid (FOLVITE) 1 MG tablet Take 1 mg by mouth daily.      Marland Kitchen lidocaine-prilocaine (EMLA) cream Apply a quarter size amount to port site 1 hour prior to  chemo. Do not rub in. Cover with plastic wrap. 30 g 3  . metoprolol tartrate (LOPRESSOR) 25 MG tablet Take 25 mg by mouth 2 (two) times daily.     Marland Kitchen NIFEdipine (PROCARDIA-XL/ADALAT CC) 60 MG 24 hr tablet Take by mouth.    . prochlorperazine (COMPAZINE) 10 MG tablet Take 1 tablet (10 mg total) by mouth every 6 (six) hours as needed for nausea or vomiting. 30 tablet 2  . sodium bicarbonate 650 MG tablet Take 650 mg by mouth 2 (two) times daily.     . Travoprost (TRAVATAN OP) Apply 1 drop to eye at bedtime. Right eye    . warfarin (COUMADIN) 5 MG tablet Take 5 mg by mouth daily. MANAGED BY PMD    . WELCHOL 3.75 g PACK Take 1 packet by mouth daily.     No current facility-administered medications on file prior to visit.     Allergies  Allergen Reactions  . Sulfonamide Derivatives     REACTION: rash    Social History   Substance and Sexual Activity  Alcohol Use Not Currently    Social History   Tobacco Use  Smoking Status Former Smoker  .  Years: 25.00  . Types: Cigarettes  . Last attempt to quit: 02/24/2008  . Years since quitting: 9.4  Smokeless Tobacco Never Used    Review of Systems  Constitutional: Negative.   HENT: Negative.   Eyes: Positive for blurred vision.  Respiratory: Positive for cough and wheezing.   Cardiovascular: Negative.   Gastrointestinal: Positive for nausea.  Genitourinary: Negative.   Musculoskeletal: Negative.   Skin: Negative.   Neurological: Negative.   Endo/Heme/Allergies: Negative.   Psychiatric/Behavioral: Negative.     Objective   Vitals:   08/19/17 0911  BP: (!) 151/72  Pulse: 84  Resp: 16  Temp: 98 F (36.7 C)    Physical Exam  Constitutional: She is oriented to person, place, and time. She appears well-developed and well-nourished.  HENT:  Head: Normocephalic and atraumatic.  Cardiovascular: Normal rate and regular rhythm. Exam reveals no gallop and no friction rub.  No murmur heard. Pulmonary/Chest: Effort normal. No  stridor. No respiratory distress. She has wheezes. She has no rales.  Neurological: She is alert and oriented to person, place, and time.  Skin: Skin is warm and dry.  Vitals reviewed. Oncology notes reviewed  Assessment   Right lung carcinoma, need for central venous access  Plan   Patient is scheduled for Port-A-Cath insertion on 08/25/2017.  The risks and benefits of the procedure including bleeding, infection, and pneumothorax were fully explained to the patient, who gave informed consent.

## 2017-08-19 NOTE — Patient Instructions (Signed)
Implanted Port Insertion Implanted port insertion is a procedure to put in a port and catheter. The port is a device with an injectable disk that can be accessed by your health care provider. The port is connected to a vein in the chest or neck by a small flexible tube (catheter). There are different types of ports. The implanted port may be used as a long-term IV access for:  Medicines, such as chemotherapy.  Fluids.  Liquid nutrition, such as total parenteral nutrition (TPN).  Blood samples.  Having a port means that your health care provider will not need to use the veins in your arms for these procedures. Tell a health care provider about:  Any allergies you have.  All medicines you are taking, especially blood thinners, as well as any vitamins, herbs, eye drops, creams, over-the-counter medicines, and steroids.  Any problems you or family members have had with anesthetic medicines.  Any blood disorders you have.  Any surgeries you have had.  Any medical conditions you have, including diabetes or kidney problems.  Whether you are pregnant or may be pregnant. What are the risks? Generally, this is a safe procedure. However, problems may occur, including:  Allergic reactions to medicines or dyes.  Damage to other structures or organs.  Infection.  Damage to the blood vessel, bruising, or bleeding at the puncture site.  Blood clot.  Breakdown of the skin over the port.  A collection of air in the chest that can cause one of the lungs to collapse (pneumothorax). This is rare.  What happens before the procedure? Staying hydrated Follow instructions from your health care provider about hydration, which may include:  Up to 2 hours before the procedure - you may continue to drink clear liquids, such as water, clear fruit juice, black coffee, and plain tea.  Eating and drinking restrictions  Follow instructions from your health care provider about eating and drinking,  which may include: ? 8 hours before the procedure - stop eating heavy meals or foods such as meat, fried foods, or fatty foods. ? 6 hours before the procedure - stop eating light meals or foods, such as toast or cereal. ? 6 hours before the procedure - stop drinking milk or drinks that contain milk. ? 2 hours before the procedure - stop drinking clear liquids. Medicines  Ask your health care provider about: ? Changing or stopping your regular medicines. This is especially important if you are taking diabetes medicines or blood thinners. ? Taking medicines such as aspirin and ibuprofen. These medicines can thin your blood. Do not take these medicines before your procedure if your health care provider instructs you not to.  You may be given antibiotic medicine to help prevent infection. General instructions  Plan to have someone take you home from the hospital or clinic.  If you will be going home right after the procedure, plan to have someone with you for 24 hours.  You may have blood tests.  You may be asked to shower with a germ-killing soap. What happens during the procedure?  To lower your risk of infection: ? Your health care team will wash or sanitize their hands. ? Your skin will be washed with soap. ? Hair may be removed from the surgical area.  An IV tube will be inserted into one of your veins.  You will be given one or more of the following: ? A medicine to help you relax (sedative). ? A medicine to numb the area (local anesthetic).    Two small cuts (incisions) will be made to insert the port. ? One incision will be made in your neck to get access to the vein where the catheter will lie. ? The other incision will be made in the upper chest. This is where the port will lie.  The procedure may be done using continuous X-ray (fluoroscopy) or other imaging tools for guidance.  The port and catheter will be placed. There may be a small, raised area where the port  is.  The port will be flushed with a salt solution (saline), and blood will be drawn to make sure that it is working correctly.  The incisions will be closed.  Bandages (dressings) may be placed over the incisions. The procedure may vary among health care providers and hospitals. What happens after the procedure?  Your blood pressure, heart rate, breathing rate, and blood oxygen level will be monitored until the medicines you were given have worn off.  Do not drive for 24 hours if you were given a sedative.  You will be given a manufacturer's information card for the type of port that you have. Keep this with you.  Your port will need to be flushed and checked as told by your health care provider, usually every few weeks.  A chest X-ray will be done to: ? Check the placement of the port. ? Make sure there is no injury to your lung. Summary  Implanted port insertion is a procedure to put in a port and catheter.  The implanted port is used as a long-term IV access.  The port will need to be flushed and checked as told by your health care provider, usually every few weeks.  Keep your manufacturer's information card with you at all times. This information is not intended to replace advice given to you by your health care provider. Make sure you discuss any questions you have with your health care provider. Document Released: 11/30/2012 Document Revised: 01/01/2016 Document Reviewed: 01/01/2016 Elsevier Interactive Patient Education  2017 Elsevier Inc. Implanted Port Home Guide An implanted port is a type of central line that is placed under the skin. Central lines are used to provide IV access when treatment or nutrition needs to be given through a person's veins. Implanted ports are used for long-term IV access. An implanted port may be placed because:  You need IV medicine that would be irritating to the small veins in your hands or arms.  You need long-term IV medicines, such as  antibiotics.  You need IV nutrition for a long period.  You need frequent blood draws for lab tests.  You need dialysis.  Implanted ports are usually placed in the chest area, but they can also be placed in the upper arm, the abdomen, or the leg. An implanted port has two main parts:  Reservoir. The reservoir is round and will appear as a small, raised area under your skin. The reservoir is the part where a needle is inserted to give medicines or draw blood.  Catheter. The catheter is a thin, flexible tube that extends from the reservoir. The catheter is placed into a large vein. Medicine that is inserted into the reservoir goes into the catheter and then into the vein.  How will I care for my incision site? Do not get the incision site wet. Bathe or shower as directed by your health care provider. How is my port accessed? Special steps must be taken to access the port:  Before the port is accessed,   a numbing cream can be placed on the skin. This helps numb the skin over the port site.  Your health care provider uses a sterile technique to access the port. ? Your health care provider must put on a mask and sterile gloves. ? The skin over your port is cleaned carefully with an antiseptic and allowed to dry. ? The port is gently pinched between sterile gloves, and a needle is inserted into the port.  Only "non-coring" port needles should be used to access the port. Once the port is accessed, a blood return should be checked. This helps ensure that the port is in the vein and is not clogged.  If your port needs to remain accessed for a constant infusion, a clear (transparent) bandage will be placed over the needle site. The bandage and needle will need to be changed every week, or as directed by your health care provider.  Keep the bandage covering the needle clean and dry. Do not get it wet. Follow your health care provider's instructions on how to take a shower or bath while the port is  accessed.  If your port does not need to stay accessed, no bandage is needed over the port.  What is flushing? Flushing helps keep the port from getting clogged. Follow your health care provider's instructions on how and when to flush the port. Ports are usually flushed with saline solution or a medicine called heparin. The need for flushing will depend on how the port is used.  If the port is used for intermittent medicines or blood draws, the port will need to be flushed: ? After medicines have been given. ? After blood has been drawn. ? As part of routine maintenance.  If a constant infusion is running, the port may not need to be flushed.  How long will my port stay implanted? The port can stay in for as long as your health care provider thinks it is needed. When it is time for the port to come out, surgery will be done to remove it. The procedure is similar to the one performed when the port was put in. When should I seek immediate medical care? When you have an implanted port, you should seek immediate medical care if:  You notice a bad smell coming from the incision site.  You have swelling, redness, or drainage at the incision site.  You have more swelling or pain at the port site or the surrounding area.  You have a fever that is not controlled with medicine.  This information is not intended to replace advice given to you by your health care provider. Make sure you discuss any questions you have with your health care provider. Document Released: 02/09/2005 Document Revised: 07/18/2015 Document Reviewed: 10/17/2012 Elsevier Interactive Patient Education  2017 Elsevier Inc.  

## 2017-08-19 NOTE — H&P (Signed)
Tasha [name]; [id number]; 03-19-1965   HPI Patient is a 52 year old white female who was referred to my care by Dr. Delton Coombes of oncology for Port-A-Cath placement.  She is undergoing chemotherapy and radiation therapy for right lung cancer.  She currently denies any pain.  She needs a Port-A-Cath placed for central venous access. Past Medical History:  Diagnosis Date  . Anemia   . Aortic insufficiency   . Asthma   . Breast nodule   . Carotid artery disease ([place])   . 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 CKD (chronic kidney disease) stage 4, GFR 15-29 ml/min (HCC)   . COPD (chronic obstructive pulmonary disease) ([place])   . CRI (chronic renal insufficiency)   . Folic acid deficiency   . Glaucoma   . Graves disease   . Hyperlipidemia   . Hypertension   . Iron deficiency   . Lung cancer ([place])    non-small cell right lung  . Recurrent UTI   . Renal cyst   . 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 Sonny Dandy    Past Surgical History:  Procedure Laterality Date  . BRONCHOSCOPY    . COLONOSCOPY    . ORIF ORBITAL FRACTURE  1996   Left  . SPINAL FUSION  1994   At C3-5    Family History  Problem Relation Age of Onset  . Heart attack Father   . Diverticulitis Mother   . Fibromyalgia Mother   . Hypertension Brother     Current Outpatient Medications on File Prior to Visit  Medication Sig Dispense Refill  . aspirin EC 81 MG tablet Take 81 mg by mouth daily.    Marland Kitchen albuterol (PROAIR HFA) 108 (90 BASE) MCG/ACT inhaler Inhale into the lungs as needed.      Marland Kitchen amLODipine (NORVASC) 10 MG tablet Take 10 mg by mouth daily.    . calcitRIOL (ROCALTROL) 0.25 MCG capsule Take 1 capsule by mouth daily.    . ferrous sulfate 325 (65 FE) MG tablet Take 325 mg by mouth daily.      . folic acid (FOLVITE) 1 MG tablet Take 1 mg by mouth daily.      Marland Kitchen lidocaine-prilocaine (EMLA) cream Apply a quarter size amount to port site 1 hour prior to  chemo. Do not rub in. Cover with plastic wrap. 30 g 3  . metoprolol tartrate (LOPRESSOR) 25 MG tablet Take 25 mg by mouth 2 (two) times daily.     Marland Kitchen NIFEdipine (PROCARDIA-XL/ADALAT CC) 60 MG 24 hr tablet Take by mouth.    . prochlorperazine (COMPAZINE) 10 MG tablet Take 1 tablet (10 mg total) by mouth every 6 (six) hours as needed for nausea or vomiting. 30 tablet 2  . sodium bicarbonate 650 MG tablet Take 650 mg by mouth 2 (two) times daily.     . Travoprost (TRAVATAN OP) Apply 1 drop to eye at bedtime. Right eye    . warfarin (COUMADIN) 5 MG tablet Take 5 mg by mouth daily. MANAGED BY PMD    . WELCHOL 3.75 g PACK Take 1 packet by mouth daily.     No current facility-administered medications on file prior to visit.     Allergies  Allergen Reactions  . Sulfonamide Derivatives     REACTION: rash    Social History   Substance and Sexual Activity  Alcohol Use Not Currently    Social History   Tobacco Use  Smoking Status Former Smoker  .  Years: 25.00  . Types: Cigarettes  . Last attempt to quit: 02/24/2008  . Years since quitting: 9.4  Smokeless Tobacco Never Used    Review of Systems  Constitutional: Negative.   HENT: Negative.   Eyes: Positive for blurred vision.  Respiratory: Positive for cough and wheezing.   Cardiovascular: Negative.   Gastrointestinal: Positive for nausea.  Genitourinary: Negative.   Musculoskeletal: Negative.   Skin: Negative.   Neurological: Negative.   Endo/Heme/Allergies: Negative.   Psychiatric/Behavioral: Negative.     Objective   Vitals:   08/19/17 0911  BP: (!) 151/72  Pulse: 84  Resp: 16  Temp: 98 F (36.7 C)    Physical Exam  Constitutional: She is oriented to person, place, and time. She appears well-developed and well-nourished.  HENT:  Head: Normocephalic and atraumatic.  Cardiovascular: Normal rate and regular rhythm. Exam reveals no gallop and no friction rub.  No murmur heard. Pulmonary/Chest: Effort normal. No  stridor. No respiratory distress. She has wheezes. She has no rales.  Neurological: She is alert and oriented to person, place, and time.  Skin: Skin is warm and dry.  Vitals reviewed. Oncology notes reviewed  Assessment   Right lung carcinoma, need for central venous access  Plan   Patient is scheduled for Port-A-Cath insertion on 08/25/2017.  The risks and benefits of the procedure including bleeding, infection, and pneumothorax were fully explained to the patient, who gave informed consent.

## 2017-08-20 ENCOUNTER — Other Ambulatory Visit (HOSPITAL_COMMUNITY): Payer: Managed Care, Other (non HMO)

## 2017-08-20 ENCOUNTER — Encounter (HOSPITAL_COMMUNITY): Payer: Managed Care, Other (non HMO)

## 2017-08-20 ENCOUNTER — Encounter (HOSPITAL_COMMUNITY)
Admission: RE | Admit: 2017-08-20 | Discharge: 2017-08-20 | Disposition: A | Discharge status: Home / Self Care | Payer: Managed Care, Other (non HMO) | Source: Ambulatory Visit | Attending: General Surgery | Admitting: General Surgery

## 2017-08-20 ENCOUNTER — Ambulatory Visit (HOSPITAL_COMMUNITY): Payer: Managed Care, Other (non HMO) | Admitting: Hematology

## 2017-08-20 ENCOUNTER — Ambulatory Visit (HOSPITAL_COMMUNITY): Payer: Managed Care, Other (non HMO)

## 2017-08-23 ENCOUNTER — Encounter (HOSPITAL_COMMUNITY): Payer: Self-pay

## 2017-08-25 ENCOUNTER — Ambulatory Visit (HOSPITAL_COMMUNITY)
Admission: RE | Admit: 2017-08-25 | Discharge: 2017-08-25 | Disposition: A | Discharge status: Home / Self Care | Payer: Managed Care, Other (non HMO) | Source: Ambulatory Visit | Attending: General Surgery | Admitting: General Surgery

## 2017-08-25 ENCOUNTER — Inpatient Hospital Stay (HOSPITAL_COMMUNITY): Payer: Managed Care, Other (non HMO) | Attending: Hematology | Admitting: Hematology

## 2017-08-25 ENCOUNTER — Other Ambulatory Visit (HOSPITAL_COMMUNITY): Payer: Managed Care, Other (non HMO)

## 2017-08-25 ENCOUNTER — Encounter (HOSPITAL_COMMUNITY)
Admission: RE | Disposition: A | Discharge status: Home / Self Care | Payer: Self-pay | Source: Ambulatory Visit | Attending: General Surgery

## 2017-08-25 ENCOUNTER — Encounter (HOSPITAL_COMMUNITY): Payer: Self-pay | Admitting: *Deleted

## 2017-08-25 ENCOUNTER — Ambulatory Visit (HOSPITAL_COMMUNITY): Payer: Managed Care, Other (non HMO) | Admitting: Anesthesiology

## 2017-08-25 ENCOUNTER — Ambulatory Visit (HOSPITAL_COMMUNITY): Payer: Managed Care, Other (non HMO)

## 2017-08-25 ENCOUNTER — Inpatient Hospital Stay (HOSPITAL_COMMUNITY): Payer: Managed Care, Other (non HMO)

## 2017-08-25 ENCOUNTER — Encounter (HOSPITAL_COMMUNITY): Payer: Self-pay | Admitting: Hematology

## 2017-08-25 VITALS — BP 127/59 | HR 73 | Temp 97.5°F | Resp 16 | Wt 86.8 lb

## 2017-08-25 DIAGNOSIS — Z7901 Long term (current) use of anticoagulants: Secondary | ICD-10-CM | POA: Diagnosis not present

## 2017-08-25 DIAGNOSIS — I129 Hypertensive chronic kidney disease with stage 1 through stage 4 chronic kidney disease, or unspecified chronic kidney disease: Secondary | ICD-10-CM | POA: Insufficient documentation

## 2017-08-25 DIAGNOSIS — Z95828 Presence of other vascular implants and grafts: Secondary | ICD-10-CM

## 2017-08-25 DIAGNOSIS — N184 Chronic kidney disease, stage 4 (severe): Secondary | ICD-10-CM | POA: Insufficient documentation

## 2017-08-25 DIAGNOSIS — C3401 Malignant neoplasm of right main bronchus: Secondary | ICD-10-CM | POA: Diagnosis present

## 2017-08-25 DIAGNOSIS — J449 Chronic obstructive pulmonary disease, unspecified: Secondary | ICD-10-CM | POA: Diagnosis not present

## 2017-08-25 DIAGNOSIS — I739 Peripheral vascular disease, unspecified: Secondary | ICD-10-CM | POA: Diagnosis not present

## 2017-08-25 DIAGNOSIS — E785 Hyperlipidemia, unspecified: Secondary | ICD-10-CM | POA: Insufficient documentation

## 2017-08-25 DIAGNOSIS — C3490 Malignant neoplasm of unspecified part of unspecified bronchus or lung: Secondary | ICD-10-CM

## 2017-08-25 DIAGNOSIS — R05 Cough: Secondary | ICD-10-CM | POA: Diagnosis not present

## 2017-08-25 DIAGNOSIS — Z79899 Other long term (current) drug therapy: Secondary | ICD-10-CM | POA: Insufficient documentation

## 2017-08-25 DIAGNOSIS — M318 Other specified necrotizing vasculopathies: Secondary | ICD-10-CM | POA: Diagnosis not present

## 2017-08-25 DIAGNOSIS — E611 Iron deficiency: Secondary | ICD-10-CM | POA: Diagnosis not present

## 2017-08-25 DIAGNOSIS — N185 Chronic kidney disease, stage 5: Secondary | ICD-10-CM | POA: Diagnosis not present

## 2017-08-25 DIAGNOSIS — I351 Nonrheumatic aortic (valve) insufficiency: Secondary | ICD-10-CM | POA: Diagnosis not present

## 2017-08-25 DIAGNOSIS — Z87891 Personal history of nicotine dependence: Secondary | ICD-10-CM | POA: Insufficient documentation

## 2017-08-25 DIAGNOSIS — D649 Anemia, unspecified: Secondary | ICD-10-CM | POA: Insufficient documentation

## 2017-08-25 DIAGNOSIS — I120 Hypertensive chronic kidney disease with stage 5 chronic kidney disease or end stage renal disease: Secondary | ICD-10-CM | POA: Diagnosis not present

## 2017-08-25 DIAGNOSIS — C3491 Malignant neoplasm of unspecified part of right bronchus or lung: Secondary | ICD-10-CM | POA: Diagnosis present

## 2017-08-25 DIAGNOSIS — C3431 Malignant neoplasm of lower lobe, right bronchus or lung: Secondary | ICD-10-CM

## 2017-08-25 DIAGNOSIS — Z882 Allergy status to sulfonamides status: Secondary | ICD-10-CM | POA: Diagnosis not present

## 2017-08-25 DIAGNOSIS — Z8249 Family history of ischemic heart disease and other diseases of the circulatory system: Secondary | ICD-10-CM | POA: Insufficient documentation

## 2017-08-25 DIAGNOSIS — R042 Hemoptysis: Secondary | ICD-10-CM | POA: Insufficient documentation

## 2017-08-25 DIAGNOSIS — Z7982 Long term (current) use of aspirin: Secondary | ICD-10-CM | POA: Insufficient documentation

## 2017-08-25 DIAGNOSIS — Z5111 Encounter for antineoplastic chemotherapy: Secondary | ICD-10-CM | POA: Diagnosis present

## 2017-08-25 HISTORY — PX: PORTACATH PLACEMENT: SHX2246

## 2017-08-25 LAB — COMPREHENSIVE METABOLIC PANEL
ALBUMIN: 3.3 g/dL — AB (ref 3.5–5.0)
ALK PHOS: 47 U/L (ref 38–126)
ALT: 10 U/L (ref 0–44)
ANION GAP: 15 (ref 5–15)
AST: 19 U/L (ref 15–41)
BILIRUBIN TOTAL: 0.3 mg/dL (ref 0.3–1.2)
BUN: 60 mg/dL — ABNORMAL HIGH (ref 6–20)
CALCIUM: 8.7 mg/dL — AB (ref 8.9–10.3)
CO2: 20 mmol/L — ABNORMAL LOW (ref 22–32)
Chloride: 87 mmol/L — ABNORMAL LOW (ref 98–111)
Creatinine, Ser: 4.4 mg/dL — ABNORMAL HIGH (ref 0.44–1.00)
GFR calc Af Amer: 12 mL/min — ABNORMAL LOW (ref >60)
GFR, EST NON AFRICAN AMERICAN: 10 mL/min — AB (ref >60)
GLUCOSE: 114 mg/dL — AB (ref 70–99)
POTASSIUM: 4.4 mmol/L (ref 3.5–5.1)
Sodium: 122 mmol/L — ABNORMAL LOW (ref 135–145)
TOTAL PROTEIN: 6.6 g/dL (ref 6.5–8.1)

## 2017-08-25 LAB — CBC WITH DIFFERENTIAL/PLATELET
BASOS ABS: 0 10*3/uL (ref 0.0–0.1)
BASOS PCT: 0 %
EOS PCT: 2 %
Eosinophils Absolute: 0.1 10*3/uL (ref 0.0–0.7)
HCT: 23.5 % — ABNORMAL LOW (ref 36.0–46.0)
Hemoglobin: 8.2 g/dL — ABNORMAL LOW (ref 12.0–15.0)
Lymphocytes Relative: 13 %
Lymphs Abs: 0.6 10*3/uL — ABNORMAL LOW (ref 0.7–4.0)
MCH: 35 pg — ABNORMAL HIGH (ref 26.0–34.0)
MCHC: 34.9 g/dL (ref 30.0–36.0)
MCV: 100.4 fL — ABNORMAL HIGH (ref 78.0–100.0)
MONO ABS: 0.1 10*3/uL (ref 0.1–1.0)
Monocytes Relative: 3 %
NEUTROS ABS: 4 10*3/uL (ref 1.7–7.7)
Neutrophils Relative %: 82 %
PLATELETS: 330 10*3/uL (ref 150–400)
RBC: 2.34 MIL/uL — ABNORMAL LOW (ref 3.87–5.11)
RDW: 12.3 % (ref 11.5–15.5)
WBC: 4.9 10*3/uL (ref 4.0–10.5)

## 2017-08-25 SURGERY — INSERTION, TUNNELED CENTRAL VENOUS DEVICE, WITH PORT
Anesthesia: Monitor Anesthesia Care | Laterality: Left

## 2017-08-25 MED ORDER — MEPERIDINE HCL 50 MG/ML IJ SOLN: 6.2500 mg | INTRAMUSCULAR | Status: DC | PRN

## 2017-08-25 MED ORDER — HYDROCOD POLST-CPM POLST ER 10-8 MG/5ML PO SUER
5.0000 mL | Freq: Once | ORAL | Status: AC
  Administered 2017-08-25: 5 mL via ORAL
  Filled 2017-08-25: qty 5

## 2017-08-25 MED ORDER — MIDAZOLAM HCL 5 MG/5ML IJ SOLN
INTRAMUSCULAR | Status: DC | PRN
  Administered 2017-08-25 (×2): 1 mg via INTRAVENOUS

## 2017-08-25 MED ORDER — FENTANYL CITRATE (PF) 100 MCG/2ML IJ SOLN
INTRAMUSCULAR | Status: DC | PRN
  Administered 2017-08-25: 50 ug via INTRAVENOUS

## 2017-08-25 MED ORDER — LACTATED RINGERS IV SOLN: INTRAVENOUS | Status: DC

## 2017-08-25 MED ORDER — MIDAZOLAM HCL 2 MG/2ML IJ SOLN
INTRAMUSCULAR | Status: AC
  Filled 2017-08-25: qty 2

## 2017-08-25 MED ORDER — SODIUM CHLORIDE 0.9 % IV SOLN
68.6000 mg | Freq: Once | INTRAVENOUS | Status: AC
  Administered 2017-08-25: 70 mg via INTRAVENOUS
  Filled 2017-08-25: qty 7

## 2017-08-25 MED ORDER — LACTATED RINGERS IV SOLN
INTRAVENOUS | Status: DC
  Administered 2017-08-25: 08:00:00 via INTRAVENOUS

## 2017-08-25 MED ORDER — SODIUM CHLORIDE 0.9 % IV SOLN
Freq: Once | INTRAVENOUS | Status: AC
  Administered 2017-08-25: 11:00:00 via INTRAVENOUS

## 2017-08-25 MED ORDER — FENTANYL CITRATE (PF) 100 MCG/2ML IJ SOLN
INTRAMUSCULAR | Status: AC
  Filled 2017-08-25: qty 2

## 2017-08-25 MED ORDER — CEFAZOLIN SODIUM-DEXTROSE 2-4 GM/100ML-% IV SOLN
2.0000 g | INTRAVENOUS | Status: AC
  Administered 2017-08-25: 2 g via INTRAVENOUS
  Filled 2017-08-25: qty 100

## 2017-08-25 MED ORDER — DEXAMETHASONE SODIUM PHOSPHATE 100 MG/10ML IJ SOLN
20.0000 mg | Freq: Once | INTRAMUSCULAR | Status: AC
  Administered 2017-08-25: 20 mg via INTRAVENOUS
  Filled 2017-08-25: qty 2

## 2017-08-25 MED ORDER — DIPHENHYDRAMINE HCL 50 MG/ML IJ SOLN
50.0000 mg | Freq: Once | INTRAMUSCULAR | Status: AC
  Administered 2017-08-25: 50 mg via INTRAVENOUS
  Filled 2017-08-25: qty 1

## 2017-08-25 MED ORDER — EPHEDRINE SULFATE 50 MG/ML IJ SOLN
INTRAMUSCULAR | Status: DC | PRN
  Administered 2017-08-25: 10 mg via INTRAVENOUS

## 2017-08-25 MED ORDER — PROPOFOL 10 MG/ML IV BOLUS
INTRAVENOUS | Status: DC | PRN
  Administered 2017-08-25: 120 mg via INTRAVENOUS
  Administered 2017-08-25: 10 mg via INTRAVENOUS

## 2017-08-25 MED ORDER — HEPARIN SOD (PORK) LOCK FLUSH 100 UNIT/ML IV SOLN
INTRAVENOUS | Status: DC | PRN
  Administered 2017-08-25: 400 [IU] via INTRAVENOUS

## 2017-08-25 MED ORDER — LIDOCAINE HCL (PF) 1 % IJ SOLN
INTRAMUSCULAR | Status: AC
  Filled 2017-08-25: qty 30

## 2017-08-25 MED ORDER — PROMETHAZINE HCL 25 MG/ML IJ SOLN: 6.2500 mg | INTRAMUSCULAR | Status: DC | PRN

## 2017-08-25 MED ORDER — PALONOSETRON HCL INJECTION 0.25 MG/5ML
0.2500 mg | Freq: Once | INTRAVENOUS | Status: AC
  Administered 2017-08-25: 0.25 mg via INTRAVENOUS
  Filled 2017-08-25: qty 5

## 2017-08-25 MED ORDER — HYDROCODONE-ACETAMINOPHEN 7.5-325 MG PO TABS: 1.0000 | ORAL_TABLET | Freq: Once | ORAL | Status: DC | PRN

## 2017-08-25 MED ORDER — HEPARIN SOD (PORK) LOCK FLUSH 100 UNIT/ML IV SOLN
500.0000 [IU] | Freq: Once | INTRAVENOUS | Status: AC | PRN
  Administered 2017-08-25: 500 [IU]
  Filled 2017-08-25: qty 5

## 2017-08-25 MED ORDER — LIDOCAINE HCL (PF) 1 % IJ SOLN
INTRAMUSCULAR | Status: DC | PRN
  Administered 2017-08-25: 6 mL

## 2017-08-25 MED ORDER — LIDOCAINE HCL 1 % IJ SOLN
INTRAMUSCULAR | Status: DC | PRN
  Administered 2017-08-25: 40 mg via INTRADERMAL

## 2017-08-25 MED ORDER — FAMOTIDINE IN NACL 20-0.9 MG/50ML-% IV SOLN
20.0000 mg | Freq: Once | INTRAVENOUS | Status: AC
  Administered 2017-08-25: 20 mg via INTRAVENOUS
  Filled 2017-08-25: qty 50

## 2017-08-25 MED ORDER — HYDROCODONE-ACETAMINOPHEN 5-325 MG PO TABS: 1.0000 | ORAL_TABLET | Freq: Four times a day (QID) | ORAL | 0 refills | Status: DC | PRN

## 2017-08-25 MED ORDER — HYDROMORPHONE HCL 1 MG/ML IJ SOLN: 0.2500 mg | INTRAMUSCULAR | Status: DC | PRN

## 2017-08-25 MED ORDER — HEPARIN SOD (PORK) LOCK FLUSH 100 UNIT/ML IV SOLN
INTRAVENOUS | Status: AC
  Filled 2017-08-25: qty 5

## 2017-08-25 MED ORDER — HYDROCOD POLST-CPM POLST ER 10-8 MG/5ML PO SUER: 5.0000 mL | Freq: Two times a day (BID) | ORAL | 0 refills | Status: DC | PRN

## 2017-08-25 MED ORDER — SUCCINYLCHOLINE CHLORIDE 20 MG/ML IJ SOLN
INTRAMUSCULAR | Status: DC | PRN
  Administered 2017-08-25: 120 mg via INTRAVENOUS

## 2017-08-25 MED ORDER — PROPOFOL 500 MG/50ML IV EMUL
INTRAVENOUS | Status: DC | PRN
  Administered 2017-08-25: 75 ug/kg/min via INTRAVENOUS

## 2017-08-25 MED ORDER — SODIUM CHLORIDE 0.9% FLUSH
10.0000 mL | INTRAVENOUS | Status: DC | PRN
  Administered 2017-08-25: 10 mL
  Filled 2017-08-25: qty 10

## 2017-08-25 MED ORDER — SODIUM CHLORIDE 0.9 % IV SOLN
45.0000 mg/m2 | Freq: Once | INTRAVENOUS | Status: AC
  Administered 2017-08-25: 60 mg via INTRAVENOUS
  Filled 2017-08-25: qty 10

## 2017-08-25 MED ORDER — PROPOFOL 10 MG/ML IV BOLUS
INTRAVENOUS | Status: AC
  Filled 2017-08-25: qty 40

## 2017-08-25 MED ORDER — SODIUM CHLORIDE 0.9 % IV SOLN
INTRAVENOUS | Status: AC | PRN
  Administered 2017-08-25: 500 mL via INTRAMUSCULAR

## 2017-08-25 SURGICAL SUPPLY — 33 items
BAG DECANTER FOR FLEXI CONT (MISCELLANEOUS) ×2 IMPLANT
CHLORAPREP W/TINT 10.5 ML (MISCELLANEOUS) ×2 IMPLANT
CLOTH BEACON ORANGE TIMEOUT ST (SAFETY) ×2 IMPLANT
COVER LIGHT HANDLE STERIS (MISCELLANEOUS) ×4 IMPLANT
DECANTER SPIKE VIAL GLASS SM (MISCELLANEOUS) ×2 IMPLANT
DERMABOND ADVANCED (GAUZE/BANDAGES/DRESSINGS) ×1
DERMABOND ADVANCED .7 DNX12 (GAUZE/BANDAGES/DRESSINGS) ×1 IMPLANT
DRAPE C-ARM FOLDED MOBILE STRL (DRAPES) ×2 IMPLANT
DRSG TEGADERM 4X4.75 (GAUZE/BANDAGES/DRESSINGS) ×4 IMPLANT
ELECT REM PT RETURN 9FT ADLT (ELECTROSURGICAL) ×2
ELECTRODE REM PT RTRN 9FT ADLT (ELECTROSURGICAL) ×1 IMPLANT
GLOVE BIOGEL PI IND STRL 7.0 (GLOVE) ×3 IMPLANT
GLOVE BIOGEL PI IND STRL 7.5 (GLOVE) ×1 IMPLANT
GLOVE BIOGEL PI INDICATOR 7.0 (GLOVE) ×3
GLOVE BIOGEL PI INDICATOR 7.5 (GLOVE) ×1
GLOVE ECLIPSE 7.0 STRL STRAW (GLOVE) ×2 IMPLANT
GLOVE SURG SS PI 7.5 STRL IVOR (GLOVE) ×2 IMPLANT
GOWN STRL REUS W/TWL LRG LVL3 (GOWN DISPOSABLE) ×4 IMPLANT
IV NS 500ML (IV SOLUTION) ×1
IV NS 500ML BAXH (IV SOLUTION) ×1 IMPLANT
KIT PORT POWER 8FR ISP MRI (Port) ×2 IMPLANT
KIT TURNOVER KIT A (KITS) ×2 IMPLANT
NEEDLE HYPO 25X1 1.5 SAFETY (NEEDLE) ×2 IMPLANT
PACK MINOR (CUSTOM PROCEDURE TRAY) ×2 IMPLANT
PAD ARMBOARD 7.5X6 YLW CONV (MISCELLANEOUS) ×2 IMPLANT
SET BASIN LINEN APH (SET/KITS/TRAYS/PACK) ×2 IMPLANT
SPONGE GAUZE 2X2 8PLY STRL LF (GAUZE/BANDAGES/DRESSINGS) ×4 IMPLANT
SUT MNCRL AB 4-0 PS2 18 (SUTURE) ×2 IMPLANT
SUT VIC AB 3-0 SH 27 (SUTURE) ×1
SUT VIC AB 3-0 SH 27X BRD (SUTURE) ×1 IMPLANT
SYR 20CC LL (SYRINGE) ×2 IMPLANT
SYR 5ML LL (SYRINGE) ×2 IMPLANT
SYR CONTROL 10ML LL (SYRINGE) ×2 IMPLANT

## 2017-08-25 NOTE — Anesthesia Preprocedure Evaluation (Addendum)
Anesthesia Evaluation  Patient identified by MRN, date of birth, ID band Patient awake    Reviewed: Allergy & Precautions, H&P , NPO status , Patient's Chart, lab work & pertinent test results, reviewed documented beta blocker date and time   Airway Mallampati: II  TM Distance: >3 FB Neck ROM: full    Dental no notable dental hx.    Pulmonary neg pulmonary ROS, asthma , COPD,  COPD inhaler, former smoker,    Pulmonary exam normal breath sounds clear to auscultation       Cardiovascular Exercise Tolerance: Good hypertension, On Medications + Peripheral Vascular Disease  negative cardio ROS   Rhythm:regular Rate:Normal     Neuro/Psych negative neurological ROS  negative psych ROS   GI/Hepatic negative GI ROS, Neg liver ROS,   Endo/Other  negative endocrine ROSHyperthyroidism   Renal/GU CRF and ESRFnegative Renal ROS  negative genitourinary   Musculoskeletal   Abdominal   Peds  Hematology negative hematology ROS (+) anemia ,   Anesthesia Other Findings Chemotherapy for lung CA in April 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  On plaquenil which was recently d/c'd CVA times 3- associated with auto immune ds    Reproductive/Obstetrics negative OB ROS                             Anesthesia Physical Anesthesia Plan  ASA: IV  Anesthesia Plan: MAC   Post-op Pain Management:    Induction:   PONV Risk Score and Plan:   Airway Management Planned:   Additional Equipment:   Intra-op Plan:   Post-operative Plan:   Informed Consent: I have reviewed the patients History and Physical, chart, labs and discussed the procedure including the risks, benefits and alternatives for the proposed anesthesia with the patient or authorized representative who has indicated his/her understanding and acceptance.   Dental Advisory Given  Plan Discussed with: CRNA and  Anesthesiologist  Anesthesia Plan Comments:         Anesthesia Quick Evaluation

## 2017-08-25 NOTE — Assessment & Plan Note (Addendum)
1.  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  On 06/15/2011 she underwent EBUS guided biopsy.  7S lymph node was negative for malignancy.  Right hilar mass was positive for carcinoma.  Endobronchial biopsy showed poorly differentiated adenocarcinoma.  She also had endobronchial tumor debulked (75% obstructing reduced to 25% obstructing). BI had tumor 1.5 cm distal to right upper lobe takeoff.  Full EBUS staging shows no adenopathy at 11 L, 10 L, 4L or 4R.  Left mainstem and segments clear.  Right upper lobe normal.  - Her hemoptysis has improved since she stopped taking Coumadin.  She takes aspirin daily.  She is on Coumadin for vasculitis and strokes. - PET CT scan dated 07/05/2017 shows 1.5 cm medial right lower lobe nodule, and right perihilar mass with no evidence of distant metastasis.  We also talked about MRI of the brain without contrast dated 07/07/2017 which did not show any intracranial metastasis. -She saw Dr. Roxan Hockey for opinion regarding surgery.  Dr. Roxan Hockey did not think she would be a surgical candidate.  He also thought the tumor is encroaching the mediastinum. - Hemoptysis has improved over the last few days.  Complains of occasional vague chest pain which is nagging in the center of the chest.  She also complained of pain in her right shoulder and has difficulty putting it up when she is having radiation therapy.  She reportedly had neck surgery with resulting chronic right shoulder pain in certain positions.  I have given a prescription for tramadol 50 mg to be taken an hour before the radiation.  She tried taking ibuprofen, but we are concerned about her kidney function. -She started radiation therapy on  08/18/2017.  She tolerated her first weekly dose of carboplatin and paclitaxel very well.  She had severe nausea and vomiting on Saturday.  I have told her to take Compazine twice daily for the next 2 to 3 days.  She will proceed with week 2 of carboplatin and paclitaxel today.  2.  CKD: She has stage V CKD.  Hence she was being worked up for kidney transplant which is put on hold at this time.  She follows up with Dr. Elita Quick in [place].  3.  Anemia: -We have done work-up for macrocytic anemia, with a normal S96, folic acid and TSH.  Ferritin was 236 and percent saturation was 29.  Given her chronic kidney disease, she will benefit from parenteral iron therapy.  We will get authorization from her insurance for Feraheme weekly x2.  4.  Cough: -She did not have any significant hemoptysis in the last 1 week.  However she is continuing to have a lot of cough.  We will give her codeine containing cough suppressant today.  If it helps her, will prescribe it.

## 2017-08-25 NOTE — Discharge Instructions (Signed)
Implanted Port Home Guide °An implanted port is a type of central line that is placed under the skin. Central lines are used to provide IV access when treatment or nutrition needs to be given through a person’s veins. Implanted ports are used for long-term IV access. An implanted port may be placed because: °· You need IV medicine that would be irritating to the small veins in your hands or arms. °· You need long-term IV medicines, such as antibiotics. °· You need IV nutrition for a long period. °· You need frequent blood draws for lab tests. °· You need dialysis. ° °Implanted ports are usually placed in the chest area, but they can also be placed in the upper arm, the abdomen, or the leg. An implanted port has two main parts: °· Reservoir. The reservoir is round and will appear as a small, raised area under your skin. The reservoir is the part where a needle is inserted to give medicines or draw blood. °· Catheter. The catheter is a thin, flexible tube that extends from the reservoir. The catheter is placed into a large vein. Medicine that is inserted into the reservoir goes into the catheter and then into the vein. ° °How will I care for my incision site? °Do not get the incision site wet. Bathe or shower as directed by your health care provider. °How is my port accessed? °Special steps must be taken to access the port: °· Before the port is accessed, a numbing cream can be placed on the skin. This helps numb the skin over the port site. °· Your health care provider uses a sterile technique to access the port. °? Your health care provider must put on a mask and sterile gloves. °? The skin over your port is cleaned carefully with an antiseptic and allowed to dry. °? The port is gently pinched between sterile gloves, and a needle is inserted into the port. °· Only "non-coring" port needles should be used to access the port. Once the port is accessed, a blood return should be checked. This helps ensure that the port  is in the vein and is not clogged. °· If your port needs to remain accessed for a constant infusion, a clear (transparent) bandage will be placed over the needle site. The bandage and needle will need to be changed every week, or as directed by your health care provider. °· Keep the bandage covering the needle clean and dry. Do not get it wet. Follow your health care provider’s instructions on how to take a shower or bath while the port is accessed. °· If your port does not need to stay accessed, no bandage is needed over the port. ° °What is flushing? °Flushing helps keep the port from getting clogged. Follow your health care provider’s instructions on how and when to flush the port. Ports are usually flushed with saline solution or a medicine called heparin. The need for flushing will depend on how the port is used. °· If the port is used for intermittent medicines or blood draws, the port will need to be flushed: °? After medicines have been given. °? After blood has been drawn. °? As part of routine maintenance. °· If a constant infusion is running, the port may not need to be flushed. ° °How long will my port stay implanted? °The port can stay in for as long as your health care provider thinks it is needed. When it is time for the port to come out, surgery will be   done to remove it. The procedure is similar to the one performed when the port was put in. °When should I seek immediate medical care? °When you have an implanted port, you should seek immediate medical care if: °· You notice a bad smell coming from the incision site. °· You have swelling, redness, or drainage at the incision site. °· You have more swelling or pain at the port site or the surrounding area. °· You have a fever that is not controlled with medicine. ° °This information is not intended to replace advice given to you by your health care provider. Make sure you discuss any questions you have with your health care provider. °Document  Released: 02/09/2005 Document Revised: 07/18/2015 Document Reviewed: 10/17/2012 °Elsevier Interactive Patient Education © 2017 [employer] °Implanted Port Insertion, Care After °This sheet gives you information about how to care for yourself after your procedure. Your health care provider may also give you more specific instructions. If you have problems or questions, contact your health care provider. °What can I expect after the procedure? °After your procedure, it is common to have: °· Discomfort at the port insertion site. °· Bruising on the skin over the port. This should improve over 3-4 days. ° °Follow these instructions at home: °Port care °· After your port is placed, you will get a manufacturer's information card. The card has information about your port. Keep this card with you at all times. °· Take care of the port as told by your health care provider. Ask your health care provider if you or a family member can get training for taking care of the port at home. A home health care nurse may also take care of the port. °· Make sure to remember what type of port you have. °Incision care °· Follow instructions from your health care provider about how to take care of your port insertion site. Make sure you: °? Wash your hands with soap and water before you change your bandage (dressing). If soap and water are not available, use hand sanitizer. °? Change your dressing as told by your health care provider. °? Leave stitches (sutures), skin glue, or adhesive strips in place. These skin closures may need to stay in place for 2 weeks or longer. If adhesive strip edges start to loosen and curl up, you may trim the loose edges. Do not remove adhesive strips completely unless your health care provider tells you to do that. °· Check your port insertion site every day for signs of infection. Check for: °? More redness, swelling, or pain. °? More fluid or blood. °? Warmth. °? Pus or a bad smell. °General  instructions °· Do not take baths, swim, or use a hot tub until your health care provider approves. °· Do not lift anything that is heavier than 10 lb (4.5 kg) for a week, or as told by your health care provider. °· Ask your health care provider when it is okay to: °? Return to work or school. °? Resume usual physical activities or sports. °· Do not drive for 24 hours if you were given a medicine to help you relax (sedative). °· Take over-the-counter and prescription medicines only as told by your health care provider. °· Wear a medical alert bracelet in case of an emergency. This will tell any health care providers that you have a port. °· Keep all follow-up visits as told by your health care provider. This is important. °Contact a health care provider if: °· You cannot   flush your port with saline as directed, or you cannot draw blood from the port. °· You have a fever or chills. °· You have more redness, swelling, or pain around your port insertion site. °· You have more fluid or blood coming from your port insertion site. °· Your port insertion site feels warm to the touch. °· You have pus or a bad smell coming from the port insertion site. °Get help right away if: °· You have chest pain or shortness of breath. °· You have bleeding from your port that you cannot control. °Summary °· Take care of the port as told by your health care provider. °· Change your dressing as told by your health care provider. °· Keep all follow-up visits as told by your health care provider. °This information is not intended to replace advice given to you by your health care provider. Make sure you discuss any questions you have with your health care provider. °Document Released: 11/30/2012 Document Revised: 01/01/2016 Document Reviewed: 01/01/2016 °Elsevier Interactive Patient Education © 2017 Elsevier Inc. ° °

## 2017-08-25 NOTE — Anesthesia Postprocedure Evaluation (Signed)
Anesthesia Post Note  Patient: Tasha [name]  Procedure(s) Performed: INSERTION PORT-A-CATH (Left )  Patient location during evaluation: PACU Anesthesia Type: MAC Level of consciousness: awake and alert and oriented Pain management: pain level controlled Vital Signs Assessment: post-procedure vital signs reviewed and stable Respiratory status: spontaneous breathing (patient still coughing intermittenetly, but stated she would just take one of her cough drops that she uses when needed.) Cardiovascular status: stable Postop Assessment: no apparent nausea or vomiting Anesthetic complications: no     Last Vitals:  Vitals:   08/25/17 0915 08/25/17 0923  BP: (!) 105/58 98/70  Pulse: 73 74  Resp: 12 16  Temp:  36.6 C  SpO2: 96% 94%    Last Pain:  Vitals:   08/25/17 0923  TempSrc: Oral  PainSc: 0-No pain                 [name]

## 2017-08-25 NOTE — Anesthesia Procedure Notes (Signed)
Procedure Name: Intubation Date/Time: 08/25/2017 7:57 AM Performed by: Ollen Bowl, CRNA Pre-anesthesia Checklist: Patient identified, Patient being monitored, Timeout performed, Emergency Drugs available and Suction available Patient Re-evaluated:Patient Re-evaluated prior to induction Oxygen Delivery Method: Circle system utilized Preoxygenation: Pre-oxygenation with 100% oxygen Induction Type: IV induction Ventilation: Mask ventilation without difficulty Laryngoscope Size: Mac and 3 Grade View: Grade I Tube type: Oral Tube size: 7.0 mm Number of attempts: 1 Airway Equipment and Method: Stylet Placement Confirmation: ETT inserted through vocal cords under direct vision,  positive ETCO2 and breath sounds checked- equal and bilateral Secured at: 21 cm Tube secured with: Tape Dental Injury: Teeth and Oropharynx as per pre-operative assessment

## 2017-08-25 NOTE — Interval H&P Note (Signed)
History and Physical Interval Note:  08/25/2017 7:13 AM  Tasha [name]  has presented today for surgery, with the diagnosis of right lung cancer  The various methods of treatment have been discussed with the patient and family. After consideration of risks, benefits and other options for treatment, the patient has consented to  Procedure(s): INSERTION PORT-A-CATH (Left) as a surgical intervention .  The patient's history has been reviewed, patient examined, no change in status, stable for surgery.  I have reviewed the patient's chart and labs.  Questions were answered to the patient's satisfaction.     Aviva Signs

## 2017-08-25 NOTE — Patient Instructions (Signed)
[facility] Discharge Instructions for Patients Receiving Chemotherapy   Beginning January 23rd 2017 lab work for the Cancer Center will be done in the  Main lab at [facility] on 1st floor. If you have a lab appointment with the Cancer Center please come in thru the  Main Entrance and check in at the main information desk   Today you received the following chemotherapy agents Taxol and Carboplatin. Follow-up as scheduled. Call clinic for any questions or concerns  To help prevent nausea and vomiting after your treatment, we encourage you to take your nausea medication.   If you develop nausea and vomiting, or diarrhea that is not controlled by your medication, call the clinic.  The clinic phone number is [phone]. Office hours are Monday-Friday 8:30am-5:00pm.  BELOW ARE SYMPTOMS THAT SHOULD BE REPORTED IMMEDIATELY:  *FEVER GREATER THAN 101.0 F  *CHILLS WITH OR WITHOUT FEVER  NAUSEA AND VOMITING THAT IS NOT CONTROLLED WITH YOUR NAUSEA MEDICATION  *UNUSUAL SHORTNESS OF BREATH  *UNUSUAL BRUISING OR BLEEDING  TENDERNESS IN MOUTH AND THROAT WITH OR WITHOUT PRESENCE OF ULCERS  *URINARY PROBLEMS  *BOWEL PROBLEMS  UNUSUAL RASH Items with * indicate a potential emergency and should be followed up as soon as possible. If you have an emergency after office hours please contact your primary care physician or go to the nearest emergency department.  Please call the clinic during office hours if you have any questions or concerns.   You may also contact the Patient Navigator at [phone] should you have any questions or need assistance in obtaining follow up care.      Resources For Cancer Patients and their Caregivers ? American Cancer Society: Can assist with transportation, wigs, general needs, runs Look Good Feel Better.        1-888-227-6333 ? Cancer Care: Provides financial assistance, online support groups, medication/co-pay assistance.   1-800-813-HOPE (4673) ? [facility] Assists [place] cancer patients and their families through emotional , educational and financial support.  336-427-4357 ? [place] DSS Where to apply for food stamps, Medicaid and utility assistance. 336-342-1394 ? [facility]: Transportation to medical appointments. 336-347-2287 ? Social Security Administration: May apply for disability if have a Stage IV cancer. 336-342-7796 1-800-772-1213 ? [place] [facility] Transit [facility]: Assists with nutrition, care and transit needs. 336-349-2343         

## 2017-08-25 NOTE — Progress Notes (Signed)
1030 Labs reviewed with and pt seen by Dr. Delton Coombes and pt approved for chemo tx today per MD as well as Tussinex 5 ml ordered PO                     Tasha [name] tolerated chemo tx well without complaints or incident.CXR results reviewed prior to using portacath. Tussinex cough syrup effective per pt. VSS upon discharge. Port site clean,dry and intact with glue noted along incision line when dressing removed Pt discharged self ambulatory in satisfactory condition accompanied by her husband

## 2017-08-25 NOTE — Progress Notes (Signed)
[address] [place], [place] [address]   CLINIC:  Medical Oncology/Hematology  PCP:  Glenda Chroman, MD [place] [address] [phone]   REASON FOR VISIT:  Follow-up for stage III Lung Cancer  CURRENT THERAPY: Combination Chemoradiation  BRIEF ONCOLOGIC HISTORY:    Malignant neoplasm of lung ([place])   06/30/2017 Initial Diagnosis    Malignant neoplasm of bronchus and lung ([place])      08/15/2017 -  Chemotherapy    The patient had palonosetron (ALOXI) injection 0.25 mg, 0.25 mg, Intravenous,  Once, 1 of 4 cycles Administration: 0.25 mg (08/18/2017) CARBOplatin (PARAPLATIN) 70 mg in sodium chloride 0.9 % 100 mL chemo infusion, 70 mg (100 % of original dose 67.6 mg), Intravenous,  Once, 1 of 4 cycles Dose modification:   (original dose 67.6 mg, Cycle 1),   (original dose 68.6 mg, Cycle 2) Administration: 70 mg (08/18/2017) PACLitaxel (TAXOL) 60 mg in sodium chloride 0.9 % 150 mL chemo infusion (</= 12m/m2), 45 mg/m2 = 60 mg, Intravenous,  Once, 1 of 4 cycles Administration: 60 mg (08/18/2017)  for chemotherapy treatment.         CANCER STAGING: Cancer Staging No matching staging information was found for the patient.   INTERVAL HISTORY:  Ms. BKijowski89y.o. female returns for routine follow-up for stage III lung cancer. She started radiation last week. She is due for her 2nd cycle of her chemo today. Patient had porta cath placed this am. Patient has be intubated during the procedure due to her coughing. She complaining of a sore throat which might be due to the intubation and coughing. Patient was sick on Saturday after the first dose of chemo. She vomited all day. She was able to drink fluids but didn't try the nausea medications. Patient denies any hemoptysis since her last treatment.  Energy levels 25%; appetite 25%. Overall, she feels ready for next cycle of chemo today.     REVIEW OF SYSTEMS:  Review of Systems  Constitutional:  Positive for fatigue.  HENT:   Positive for sore throat.   Respiratory: Positive for cough.   Gastrointestinal: Positive for constipation, nausea and vomiting.  Neurological: Positive for dizziness.  All other systems reviewed and are negative.    PAST MEDICAL/SURGICAL HISTORY:  Past Medical History:  Diagnosis Date  . Anemia   . Aortic insufficiency   . Asthma   . Breast nodule   . Carotid artery disease ([facility]   . 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 CKD (chronic kidney disease) stage 4, GFR 15-29 ml/min (H[facility])   . COPD (chronic obstructive pulmonary disease) ([facility]   . CRI (chronic renal insufficiency)   . Folic acid deficiency   . Glaucoma   . Graves disease   . Hyperlipidemia   . Hypertension   . Iron deficiency   . Lung cancer ([facility]    non-small cell right lung  . Recurrent UTI   . Renal cyst   . Systemic vasculitis ([facility]    Not otherwise specified, Dr. KSonny Dandy  Past Surgical History:  Procedure Laterality Date  . BRONCHOSCOPY    . COLONOSCOPY    . ORIF ORBITAL FRACTURE  1996   Left  . SPINAL FUSION  1994   At C3-5     SOCIAL HISTORY:  Social History   Socioeconomic History  . Marital status: Married    Spouse name: Not on file  . Number of  children: Not on file  . Years of education: Not on file  . Highest education level: Not on file  Occupational History  . Not on file  Social Needs  . Financial resource strain: Not on file  . Food insecurity:    Worry: Not on file    Inability: Not on file  . Transportation needs:    Medical: Not on file    Non-medical: Not on file  Tobacco Use  . Smoking status: Former Smoker    Years: 25.00    Types: Cigarettes    Last attempt to quit: 02/24/2008    Years since quitting: 9.5  . Smokeless tobacco: Never Used  Substance and Sexual Activity  . Alcohol use: Not Currently  . Drug use: No  . Sexual activity: Not on file  Lifestyle  . Physical activity:    Days per week:  Not on file    Minutes per session: Not on file  . Stress: Not on file  Relationships  . Social connections:    Talks on phone: Not on file    Gets together: Not on file    Attends religious service: Not on file    Active member of club or organization: Not on file    Attends meetings of clubs or organizations: Not on file    Relationship status: Not on file  . Intimate partner violence:    Fear of current or ex partner: Not on file    Emotionally abused: Not on file    Physically abused: Not on file    Forced sexual activity: Not on file  Other Topics Concern  . Not on file  Social History Narrative   No regular exercise.    FAMILY HISTORY:  Family History  Problem Relation Age of Onset  . Heart attack Father   . Diverticulitis Mother   . Fibromyalgia Mother   . Hypertension Brother     CURRENT MEDICATIONS:  Outpatient Encounter Medications as of 08/25/2017  Medication Sig  . albuterol (PROAIR HFA) 108 (90 BASE) MCG/ACT inhaler Inhale 2 puffs into the lungs every 4 (four) hours as needed for wheezing or shortness of breath.   Marland Kitchen amLODipine (NORVASC) 10 MG tablet Take 10 mg by mouth daily.  Marland Kitchen aspirin EC 325 MG tablet Take 162.5-325 mg by mouth See admin instructions. Take 325 mg by mouth every other day alternating with 162.5 mg by mouth every other day  . calcitRIOL (ROCALTROL) 0.25 MCG capsule Take 0.25 mcg by mouth daily.   . ferrous sulfate 325 (65 FE) MG tablet Take 325 mg by mouth daily.    . folic acid (FOLVITE) 1 MG tablet Take 1 mg by mouth daily.    Marland Kitchen HYDROcodone-acetaminophen (NORCO) 5-325 MG tablet Take 1 tablet by mouth every 6 (six) hours as needed for moderate pain.  Marland Kitchen lidocaine-prilocaine (EMLA) cream Apply a quarter size amount to port site 1 hour prior to chemo. Do not rub in. Cover with plastic wrap.  . metoprolol tartrate (LOPRESSOR) 25 MG tablet Take 25 mg by mouth 2 (two) times daily.   . prochlorperazine (COMPAZINE) 10 MG tablet Take 1 tablet (10 mg  total) by mouth every 6 (six) hours as needed for nausea or vomiting.  . sodium bicarbonate 650 MG tablet Take 650 mg by mouth 2 (two) times daily.   . [DISCONTINUED] HYDROcodone-acetaminophen (NORCO) 7.5-325 MG per tablet 1 tablet   . [DISCONTINUED] HYDROmorphone (DILAUDID) injection 0.25-0.5 mg   . [DISCONTINUED] lactated ringers infusion   . [  DISCONTINUED] lactated ringers infusion   . [DISCONTINUED] meperidine (DEMEROL) injection 6.25-12.5 mg   . [DISCONTINUED] promethazine (PHENERGAN) injection 6.25-12.5 mg    No facility-administered encounter medications on file as of 08/25/2017.     ALLERGIES:  Allergies  Allergen Reactions  . Sulfonamide Derivatives Rash     PHYSICAL EXAM:  ECOG Performance status: 1  VITAL SIGNS: BP 99/34, PULSE: 81, resp: 18, temp 97.1, 02sats: 94%  Physical Exam   LABORATORY DATA:  I have reviewed the labs as listed.  CBC    Component Value Date/Time   WBC 4.9 08/25/2017 0951   RBC 2.34 (L) 08/25/2017 0951   HGB 8.2 (L) 08/25/2017 0951   HCT 23.5 (L) 08/25/2017 0951   PLT 330 08/25/2017 0951   MCV 100.4 (H) 08/25/2017 0951   MCH 35.0 (H) 08/25/2017 0951   MCHC 34.9 08/25/2017 0951   RDW 12.3 08/25/2017 0951   LYMPHSABS 0.6 (L) 08/25/2017 0951   MONOABS 0.1 08/25/2017 0951   EOSABS 0.1 08/25/2017 0951   BASOSABS 0.0 08/25/2017 0951   CMP Latest Ref Rng & Units 08/25/2017 08/18/2017 06/09/2016  Glucose 70 - 99 mg/dL 114(H) 117(H) 96  BUN 6 - 20 mg/dL 60(H) 66(H) 43(H)  Creatinine 0.44 - 1.00 mg/dL 4.40(H) 4.65(H) 3.56(H)  Sodium 135 - 145 mmol/L 122(L) 124(L) 126(L)  Potassium 3.5 - 5.1 mmol/L 4.4 4.4 4.7  Chloride 98 - 111 mmol/L 87(L) 90(L) 90(L)  CO2 22 - 32 mmol/L 20(L) 19(L) 22  Calcium 8.9 - 10.3 mg/dL 8.7(L) 9.2 9.4  Total Protein 6.5 - 8.1 g/dL 6.6 7.1 6.8  Total Bilirubin 0.3 - 1.2 mg/dL 0.3 0.3 0.4  Alkaline Phos 38 - 126 U/L 47 52 73  AST 15 - 41 U/L 19 20 24   ALT 0 - 44 U/L 10 11 9          ASSESSMENT & PLAN:    Malignant neoplasm of lung ([place]) 1.  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  On 06/15/2011 she underwent EBUS guided biopsy.  7S lymph node was negative for malignancy.  Right hilar mass was positive for carcinoma.  Endobronchial biopsy showed poorly differentiated adenocarcinoma.  She also had endobronchial tumor debulked (75% obstructing reduced to 25% obstructing). BI had tumor 1.5 cm distal to right upper lobe takeoff.  Full EBUS staging shows no adenopathy at 11 L, 10 L, 4L or 4R.  Left mainstem and segments clear.  Right upper lobe normal.  - Her hemoptysis has improved since she stopped taking Coumadin.  She takes aspirin daily.  She is on Coumadin for vasculitis and strokes. - PET CT scan dated 07/05/2017 shows 1.5 cm medial right lower lobe nodule, and right perihilar mass with no evidence of distant metastasis.  We also talked about MRI of the brain without contrast dated 07/07/2017 which did not show any intracranial metastasis. -She saw Dr. Roxan Hockey for opinion regarding surgery.  Dr. Roxan Hockey did not think she would be a surgical candidate.  He also thought the tumor is encroaching the mediastinum. - Hemoptysis has improved over the last few days.  Complains of occasional vague chest pain which is nagging in the center of the chest.  She also complained of pain in her right shoulder and  has difficulty putting it up when she is having radiation therapy.  She reportedly had neck surgery with resulting chronic right shoulder pain in certain positions.  I have given a prescription for tramadol 50 mg to be taken an hour before the radiation.  She tried taking ibuprofen, but we are concerned about her kidney function. -She  started radiation therapy on 08/18/2017.  She tolerated her first weekly dose of carboplatin and paclitaxel very well.  She had severe nausea and vomiting on Saturday.  I have told her to take Compazine twice daily for the next 2 to 3 days.  She will proceed with week 2 of carboplatin and paclitaxel today.  2.  CKD: She has stage V CKD.  Hence she was being worked up for kidney transplant which is put on hold at this time.  She follows up with Dr. Elita Quick in [place].  3.  Anemia: -We have done work-up for macrocytic anemia, with a normal J40, folic acid and TSH.  Ferritin was 236 and percent saturation was 29.  Given her chronic kidney disease, she will benefit from parenteral iron therapy.  We will get authorization from her insurance for Feraheme weekly x2.  4.  Cough: -She did not have any significant hemoptysis in the last 1 week.  However she is continuing to have a lot of cough.  We will give her codeine containing cough suppressant today.  If it helps her, will prescribe it.      Orders placed this encounter:  No orders of the defined types were placed in this encounter.     Derek Jack, MD [place] [phone]

## 2017-08-25 NOTE — Op Note (Signed)
Patient:  Tasha [name]  DOB:  Nov 26, 1965  MRN:  [id number]   Preop Diagnosis: Right lung carcinoma, need for central venous access  Postop Diagnosis: Same  Procedure: Port-A-Cath insertion  Surgeon: Aviva Signs, MD  Anes: General endotracheal  Indications: Patient is a 52 year old white female who is about to undergo chemotherapy for right lung carcinoma.  She needs central venous access.  The risks and benefits of the procedure including bleeding, infection, and pneumothorax were fully explained to the patient, who gave informed consent.  Procedure note: The patient was placed in the Trendelenburg position.  At first, MAC was attempted, but the patient continued to have coughing episodes.  It was elected to proceed with endotracheal intubation.  After this was performed, the left upper chest was prepped and draped using the usual sterile technique with DuraPrep.  Surgical site confirmation was performed.  An incision was made below the left clavicle.  Subcutaneous pocket was formed.  A needle was advanced into the left subclavian vein using the Seldinger technique without difficulty.  A guidewire was then advanced into the right atrium under fluoroscopic guidance.  An introducer peel-away sheath were placed over the guidewire.  The peel-away sheath was removed and the catheter attached to the port.  The port was placed in subcutaneous pocket.  Adequate positioning was confirmed by fluoroscopy.  Good backflow blood was noted on aspiration of the port.  The port was flushed with heparin flush and left accessed.  The subcutaneous layer was reapproximated using 3-0 Vicryl interrupted suture.  The skin was closed using a 4-0 Monocryl subcuticular suture.  Dermabond was applied.  All tape and needle counts were correct at the end of the procedure.  Patient was extubated in the operating room and transferred to PACU in stable condition.  A chest x-ray will be performed at that  time.  Complications: None  EBL: Minimal  Specimen: None

## 2017-08-25 NOTE — Patient Instructions (Signed)
[facility] Discharge Instructions  You saw Dr. Katragadda today.   Thank you for choosing [facility] at [facility] to provide your oncology and hematology care.  To afford each patient quality time with our provider, please arrive at least 15 minutes before your scheduled appointment time.   If you have a lab appointment with the Cancer Center please come in thru the  Main Entrance and check in at the main information desk  You need to re-schedule your appointment should you arrive 10 or more minutes late.  We strive to give you quality time with our providers, and arriving late affects you and other patients whose appointments are after yours.  Also, if you no show three or more times for appointments you may be dismissed from the clinic at the providers discretion.     Again, thank you for choosing [facility].  Our hope is that these requests will decrease the amount of time that you wait before being seen by our physicians.       _____________________________________________________________  Should you have questions after your visit to [facility], please contact our office at [phone] between the hours of 8:30 a.m. and 4:30 p.m.  Voicemails left after 4:30 p.m. will not be returned until the following business day.  For prescription refill requests, have your pharmacy contact our office.       Resources For Cancer Patients and their Caregivers ? American Cancer Society: Can assist with transportation, wigs, general needs, runs Look Good Feel Better.        1-888-227-6333 ? Cancer Care: Provides financial assistance, online support groups, medication/co-pay assistance.  1-800-813-HOPE (4673) ? [facility] Assists [place] cancer patients and their families through emotional , educational and financial support.  336-427-4357 ? [place] DSS Where to apply for  food stamps, Medicaid and utility assistance. 336-342-1394 ? [facility]: Transportation to medical appointments. 336-347-2287 ? Social Security Administration: May apply for disability if have a Stage IV cancer. 336-342-7796 1-800-772-1213 ? [place] Aging, Disability and Transit Services: Assists with nutrition, care and transit needs. 336-349-2343  Cancer Center Support Programs:   > Cancer Support Group  2nd Tuesday of the month 1pm-2pm, Journey Room   > Creative Journey  3rd Tuesday of the month 1130am-1pm, Journey Room     

## 2017-08-25 NOTE — Addendum Note (Signed)
Addended by: Glennie Isle on: 08/25/2017 03:32 PM   Modules accepted: Orders

## 2017-08-25 NOTE — Transfer of Care (Addendum)
Immediate Anesthesia Transfer of Care Note  Patient: Tasha [name]  Procedure(s) Performed: INSERTION PORT-A-CATH (Left )  Patient Location: PACU  Anesthesia Type:General  Level of Consciousness: awake, alert  and oriented  Airway & Oxygen Therapy: Patient Spontanous Breathing and Patient connected to nasal cannula oxygen  Post-op Assessment: Report given to RN  Post vital signs: Reviewed and stable  Last Vitals:  Vitals Value Taken Time  BP 120/71 08/25/2017  8:31 AM  Temp    Pulse 79 08/25/2017  8:36 AM  Resp 19 08/25/2017  8:36 AM  SpO2 97 % 08/25/2017  8:36 AM  Vitals shown include unvalidated device data.  Last Pain:  Vitals:   08/25/17 0639  TempSrc: Oral  PainSc: 3       Patients Stated Pain Goal: 5 (79/44/46 1901)  Complications: No apparent anesthesia complications  No complications, but patient did require general anesthesia for coughing and inability to calm cough down.  In PACU when asked about this coughing spell she had she said it occurs at least twice a day.

## 2017-08-27 ENCOUNTER — Encounter (HOSPITAL_COMMUNITY): Payer: Self-pay | Admitting: General Surgery

## 2017-08-27 MED ORDER — FULVESTRANT 250 MG/5ML IM SOLN
INTRAMUSCULAR | Status: AC
Start: 2017-08-27 — End: ?
  Filled 2017-08-27: qty 5

## 2017-09-01 ENCOUNTER — Encounter (HOSPITAL_COMMUNITY): Payer: Self-pay

## 2017-09-01 ENCOUNTER — Encounter (HOSPITAL_COMMUNITY): Payer: Self-pay | Admitting: Hematology

## 2017-09-01 ENCOUNTER — Inpatient Hospital Stay (HOSPITAL_BASED_OUTPATIENT_CLINIC_OR_DEPARTMENT_OTHER): Payer: Managed Care, Other (non HMO) | Admitting: Hematology

## 2017-09-01 ENCOUNTER — Inpatient Hospital Stay (HOSPITAL_COMMUNITY): Payer: Managed Care, Other (non HMO)

## 2017-09-01 VITALS — BP 111/67 | HR 68 | Temp 98.2°F | Resp 18 | Wt 83.8 lb

## 2017-09-01 DIAGNOSIS — N185 Chronic kidney disease, stage 5: Secondary | ICD-10-CM | POA: Diagnosis not present

## 2017-09-01 DIAGNOSIS — E611 Iron deficiency: Secondary | ICD-10-CM

## 2017-09-01 DIAGNOSIS — I120 Hypertensive chronic kidney disease with stage 5 chronic kidney disease or end stage renal disease: Secondary | ICD-10-CM

## 2017-09-01 DIAGNOSIS — D539 Nutritional anemia, unspecified: Secondary | ICD-10-CM

## 2017-09-01 DIAGNOSIS — C3490 Malignant neoplasm of unspecified part of unspecified bronchus or lung: Secondary | ICD-10-CM

## 2017-09-01 DIAGNOSIS — Z5111 Encounter for antineoplastic chemotherapy: Secondary | ICD-10-CM | POA: Diagnosis not present

## 2017-09-01 DIAGNOSIS — C3401 Malignant neoplasm of right main bronchus: Secondary | ICD-10-CM

## 2017-09-01 DIAGNOSIS — D649 Anemia, unspecified: Secondary | ICD-10-CM

## 2017-09-01 DIAGNOSIS — C3431 Malignant neoplasm of lower lobe, right bronchus or lung: Secondary | ICD-10-CM

## 2017-09-01 DIAGNOSIS — N184 Chronic kidney disease, stage 4 (severe): Secondary | ICD-10-CM

## 2017-09-01 DIAGNOSIS — R05 Cough: Secondary | ICD-10-CM

## 2017-09-01 LAB — COMPREHENSIVE METABOLIC PANEL
ALT: 6 U/L (ref 0–44)
AST: 18 U/L (ref 15–41)
Albumin: 3.3 g/dL — ABNORMAL LOW (ref 3.5–5.0)
Alkaline Phosphatase: 40 U/L (ref 38–126)
Anion gap: 16 — ABNORMAL HIGH (ref 5–15)
BUN: 71 mg/dL — ABNORMAL HIGH (ref 6–20)
CHLORIDE: 90 mmol/L — AB (ref 98–111)
CO2: 17 mmol/L — ABNORMAL LOW (ref 22–32)
Calcium: 8.4 mg/dL — ABNORMAL LOW (ref 8.9–10.3)
Creatinine, Ser: 4.8 mg/dL — ABNORMAL HIGH (ref 0.44–1.00)
GFR, EST AFRICAN AMERICAN: 11 mL/min — AB (ref >60)
GFR, EST NON AFRICAN AMERICAN: 9 mL/min — AB (ref >60)
Glucose, Bld: 94 mg/dL (ref 70–99)
POTASSIUM: 3.9 mmol/L (ref 3.5–5.1)
SODIUM: 123 mmol/L — AB (ref 135–145)
Total Bilirubin: 0.4 mg/dL (ref 0.3–1.2)
Total Protein: 6.6 g/dL (ref 6.5–8.1)

## 2017-09-01 LAB — CBC WITH DIFFERENTIAL/PLATELET
Basophils Absolute: 0 10*3/uL (ref 0.0–0.1)
Basophils Relative: 1 %
EOS ABS: 0.1 10*3/uL (ref 0.0–0.7)
EOS PCT: 6 %
HCT: 23.7 % — ABNORMAL LOW (ref 36.0–46.0)
Hemoglobin: 8.2 g/dL — ABNORMAL LOW (ref 12.0–15.0)
LYMPHS PCT: 23 %
Lymphs Abs: 0.4 10*3/uL — ABNORMAL LOW (ref 0.7–4.0)
MCH: 34.7 pg — AB (ref 26.0–34.0)
MCHC: 34.6 g/dL (ref 30.0–36.0)
MCV: 100.4 fL — AB (ref 78.0–100.0)
MONO ABS: 0.2 10*3/uL (ref 0.1–1.0)
Monocytes Relative: 11 %
NEUTROS ABS: 1 10*3/uL — AB (ref 1.7–7.7)
Neutrophils Relative %: 59 %
PLATELETS: 193 10*3/uL (ref 150–400)
RBC: 2.36 MIL/uL — ABNORMAL LOW (ref 3.87–5.11)
RDW: 12.4 % (ref 11.5–15.5)
WBC: 1.7 10*3/uL — ABNORMAL LOW (ref 4.0–10.5)

## 2017-09-01 MED ORDER — HEPARIN SOD (PORK) LOCK FLUSH 100 UNIT/ML IV SOLN: 250.0000 [IU] | Freq: Once | INTRAVENOUS | Status: DC | PRN

## 2017-09-01 MED ORDER — SODIUM CHLORIDE 0.9 % IV SOLN
Freq: Once | INTRAVENOUS | Status: AC
  Administered 2017-09-01: 11:00:00 via INTRAVENOUS

## 2017-09-01 MED ORDER — SODIUM CHLORIDE 0.9% FLUSH
10.0000 mL | INTRAVENOUS | Status: DC | PRN
  Administered 2017-09-01: 10 mL
  Filled 2017-09-01: qty 10

## 2017-09-01 MED ORDER — HEPARIN SOD (PORK) LOCK FLUSH 100 UNIT/ML IV SOLN
500.0000 [IU] | Freq: Once | INTRAVENOUS | Status: AC
  Administered 2017-09-01: 500 [IU] via INTRAVENOUS

## 2017-09-01 MED ORDER — FERUMOXYTOL INJECTION 510 MG/17 ML
510.0000 mg | Freq: Once | INTRAVENOUS | Status: AC
  Administered 2017-09-01: 510 mg via INTRAVENOUS
  Filled 2017-09-01: qty 17

## 2017-09-01 NOTE — Progress Notes (Signed)
[place] [place], [place] [address]   CLINIC:  Medical Oncology/Hematology  PCP:  Tasha Chroman, MD [place] [address] [phone]   REASON FOR VISIT:  Follow-up for stage III Lung Cancer  CURRENT THERAPY: Chemoradiation therapy  BRIEF ONCOLOGIC HISTORY:    Malignant neoplasm of lung ([facility])   06/30/2017 Initial Diagnosis    Malignant neoplasm of bronchus and lung ([facility])      08/15/2017 -  Chemotherapy    The patient had palonosetron (ALOXI) injection 0.25 mg, 0.25 mg, Intravenous,  Once, 2 of 4 cycles Administration: 0.25 mg (08/18/2017), 0.25 mg (08/25/2017) CARBOplatin (PARAPLATIN) 70 mg in sodium chloride 0.9 % 100 mL chemo infusion, 70 mg (100 % of original dose 67.6 mg), Intravenous,  Once, 2 of 4 cycles Dose modification:   (original dose 67.6 mg, Cycle 1),   (original dose 68.6 mg, Cycle 2) Administration: 70 mg (08/18/2017), 70 mg (08/25/2017) PACLitaxel (TAXOL) 60 mg in sodium chloride 0.9 % 150 mL chemo infusion (</= 62m/m2), 45 mg/m2 = 60 mg, Intravenous,  Once, 2 of 4 cycles Administration: 60 mg (08/18/2017), 60 mg (08/25/2017)  for chemotherapy treatment.         CANCER STAGING: Cancer Staging No matching staging information was found for the patient.   INTERVAL HISTORY:  [name] female returns for routine follow-up for stage III lung cancer and consideration for next cycle of chemotherapy. Patient is here today with her husband. Her cough is improved with the cough medication given last visit. Report no hemoptysis. Patient reports having watery stools multiple times a day that started yesterday. There is a four odor. Patient energy levels are 50%. Her appetite is 25%. She has lost 3 pounds. She continues to drink ensure 1 and a half bottles a day. She is still receiving radiation and tolerating well. Denies any new pains. Denies any recent fevers or infections. Denies chest pain. Over all she is feel good and ready  for treatment if her labs are good.     REVIEW OF SYSTEMS:  Review of Systems  Constitutional: Positive for fatigue.  HENT:  Negative.   Respiratory: Positive for cough.   Cardiovascular: Negative.   Gastrointestinal: Positive for diarrhea (last 24hrs).  Endocrine: Negative.   Genitourinary: Negative.    Musculoskeletal: Negative.   Skin: Negative.   Neurological: Positive for dizziness.  Hematological: Negative.   Psychiatric/Behavioral: Negative.      PAST MEDICAL/SURGICAL HISTORY:  Past Medical History:  Diagnosis Date  . Anemia   . Aortic insufficiency   . Asthma   . Breast nodule   . Carotid artery disease ([facility]   . 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 CKD (chronic kidney disease) stage 4, GFR 15-29 ml/min (HCC)   . COPD (chronic obstructive pulmonary disease) ([place]   . CRI (chronic renal insufficiency)   . Folic acid deficiency   . Glaucoma   . Graves disease   . Hyperlipidemia   . Hypertension   . Iron deficiency   . Lung cancer ([place]    non-small cell right lung  . Recurrent UTI   . Renal cyst   . Systemic vasculitis ([place]    Not otherwise specified, Dr. KSonny Dandy  Past Surgical History:  Procedure Laterality Date  . BRONCHOSCOPY    . COLONOSCOPY    . ORIF ORBITAL FRACTURE  1996   Left  . PORTACATH PLACEMENT Left 08/25/2017  Procedure: INSERTION PORT-A-CATH;  Surgeon: Tasha Signs, MD;  Location: [facility];  Service: General;  Laterality: Left;  . SPINAL FUSION  1994   At C3-5     SOCIAL HISTORY:  Social History   Socioeconomic History  . Marital status: Married    Spouse name: Not on file  . Number of children: Not on file  . Years of education: Not on file  . Highest education level: Not on file  Occupational History  . Not on file  Social Needs  . Financial resource strain: Not on file  . Food insecurity:    Worry: Not on file    Inability: Not on file  . Transportation needs:    Medical: Not on  file    Non-medical: Not on file  Tobacco Use  . Smoking status: Former Smoker    Years: 25.00    Types: Cigarettes    Last attempt to quit: 02/24/2008    Years since quitting: 9.5  . Smokeless tobacco: Never Used  Substance and Sexual Activity  . Alcohol use: Not Currently  . Drug use: No  . Sexual activity: Not on file  Lifestyle  . Physical activity:    Days per week: Not on file    Minutes per session: Not on file  . Stress: Not on file  Relationships  . Social connections:    Talks on phone: Not on file    Gets together: Not on file    Attends religious service: Not on file    Active member of club or organization: Not on file    Attends meetings of clubs or organizations: Not on file    Relationship status: Not on file  . Intimate partner violence:    Fear of current or ex partner: Not on file    Emotionally abused: Not on file    Physically abused: Not on file    Forced sexual activity: Not on file  Other Topics Concern  . Not on file  Social History Narrative   No regular exercise.    FAMILY HISTORY:  Family History  Problem Relation Age of Onset  . Heart attack Father   . Diverticulitis Mother   . Fibromyalgia Mother   . Hypertension Brother     CURRENT MEDICATIONS:  Outpatient Encounter Medications as of 09/01/2017  Medication Sig  . albuterol (PROAIR HFA) 108 (90 BASE) MCG/ACT inhaler Inhale 2 puffs into the lungs every 4 (four) hours as needed for wheezing or shortness of breath.   Marland Kitchen amLODipine (NORVASC) 10 MG tablet Take 10 mg by mouth daily.  Marland Kitchen aspirin EC 325 MG tablet Take 162.5-325 mg by mouth See admin instructions. Take 325 mg by mouth every other day alternating with 162.5 mg by mouth every other day  . calcitRIOL (ROCALTROL) 0.25 MCG capsule Take 0.25 mcg by mouth daily.   . chlorpheniramine-HYDROcodone (TUSSIONEX) 10-8 MG/5ML SUER Take 5 mLs by mouth every 12 (twelve) hours as needed for cough.  . ferrous sulfate 325 (65 FE) MG tablet Take 325  mg by mouth daily.    . folic acid (FOLVITE) 1 MG tablet Take 1 mg by mouth daily.    Marland Kitchen HYDROcodone-acetaminophen (NORCO) 5-325 MG tablet Take 1 tablet by mouth every 6 (six) hours as needed for moderate pain.  Marland Kitchen lidocaine-prilocaine (EMLA) cream Apply a quarter size amount to port site 1 hour prior to chemo. Do not rub in. Cover with plastic wrap.  . metoprolol tartrate (LOPRESSOR) 25 MG tablet Take 25  mg by mouth 2 (two) times daily.   . prochlorperazine (COMPAZINE) 10 MG tablet Take 1 tablet (10 mg total) by mouth every 6 (six) hours as needed for nausea or vomiting.  . sodium bicarbonate 650 MG tablet Take 650 mg by mouth 2 (two) times daily.    No facility-administered encounter medications on file as of 09/01/2017.     ALLERGIES:  Allergies  Allergen Reactions  . Sulfonamide Derivatives Rash     PHYSICAL EXAM:  ECOG Performance status: 1  I have reviewed her vitals.  Blood pressure is 118/57.  Pulse rate is 82.  Respiratory rate is 18.  Temperature 98.1.  Saturations are 99. Physical Exam   LABORATORY DATA:  I have reviewed the labs as listed.  CBC    Component Value Date/Time   WBC 1.7 (L) 09/01/2017 0836   RBC 2.36 (L) 09/01/2017 0836   HGB 8.2 (L) 09/01/2017 0836   HCT 23.7 (L) 09/01/2017 0836   PLT 193 09/01/2017 0836   MCV 100.4 (H) 09/01/2017 0836   MCH 34.7 (H) 09/01/2017 0836   MCHC 34.6 09/01/2017 0836   RDW 12.4 09/01/2017 0836   LYMPHSABS 0.4 (L) 09/01/2017 0836   MONOABS 0.2 09/01/2017 0836   EOSABS 0.1 09/01/2017 0836   BASOSABS 0.0 09/01/2017 0836   CMP Latest Ref Rng & Units 09/01/2017 08/25/2017 08/18/2017  Glucose 70 - 99 mg/dL 94 114(H) 117(H)  BUN 6 - 20 mg/dL 71(H) 60(H) 66(H)  Creatinine 0.44 - 1.00 mg/dL 4.80(H) 4.40(H) 4.65(H)  Sodium 135 - 145 mmol/L 123(L) 122(L) 124(L)  Potassium 3.5 - 5.1 mmol/L 3.9 4.4 4.4  Chloride 98 - 111 mmol/L 90(L) 87(L) 90(L)  CO2 22 - 32 mmol/L 17(L) 20(L) 19(L)  Calcium 8.9 - 10.3 mg/dL 8.4(L) 8.7(L) 9.2    Total Protein 6.5 - 8.1 g/dL 6.6 6.6 7.1  Total Bilirubin 0.3 - 1.2 mg/dL 0.4 0.3 0.3  Alkaline Phos 38 - 126 U/L 40 47 52  AST 15 - 41 U/L 18 19 20   ALT 0 - 44 U/L 6 10 11           ASSESSMENT & PLAN:   Malignant neoplasm of lung (HCC) 1.  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 and was found to have spiculated solid nodule in the posterior right lower lobe measuring 1.6 x 1.2 cm with fullness in the right hilum.  On 06/15/2011 she underwent EBUS guided biopsy.  7S lymph node was negative for malignancy.  Right hilar mass was positive for carcinoma.  Endobronchial biopsy showed poorly differentiated adenocarcinoma.  She also had endobronchial tumor debulked (75% obstructing reduced to 25% obstructing). BI had tumor 1.5 cm distal to right upper lobe takeoff.  Full EBUS staging shows no adenopathy at 11 L, 10 L, 4L or 4R.  Left mainstem and segments clear.  Right upper lobe normal.  - Her hemoptysis has improved since she stopped taking Coumadin.  She takes aspirin daily.  She is on Coumadin for vasculitis and strokes. - PET CT scan dated 07/05/2017 shows 1.5 cm medial right lower lobe nodule, and right perihilar mass with no evidence of distant metastasis.  We also talked about MRI of the brain without contrast dated 07/07/2017 which did not show any intracranial metastasis. -She saw Dr. Roxan Hockey for opinion regarding surgery.  Dr. Roxan Hockey did not think she would be a surgical candidate.  He  also thought the tumor is encroaching the mediastinum. - Hemoptysis has improved over the last few days.  Complains of occasional vague chest pain which is nagging in the center of the chest.  She also complained of pain in her right shoulder and has difficulty putting it up when she is having radiation therapy.  She reportedly had neck surgery with resulting  chronic right shoulder pain in certain positions.  I have given a prescription for tramadol 50 mg to be taken an hour before the radiation.  She tried taking ibuprofen, but we are concerned about her kidney function. -She started radiation therapy on 08/18/2017.  She tolerated her first weekly dose of carboplatin and paclitaxel very well.  Her week 2 of treatment was on 08/25/2017.  Today her white count is low at 1.7 with ANC of 1000.  I would hold off on week 3 treatment today.  She may proceed with Feraheme infusion.  She had diarrhea for the last 24 hours, watery.  Will obtain a sample for C. difficile. -We plan to check her CBC with differential on Friday.  If it is severely low, will consider giving Neupogen after she gets radiation on Friday.  2.  CKD: She has stage V CKD.  Hence she was being worked up for kidney transplant which is put on hold at this time.  She follows up with Dr. Elita Quick in [place].  3.  Anemia: -We have done work-up for macrocytic anemia, with a normal K93, folic acid and TSH.  Ferritin was 236 and percent saturation was 29.  Given her chronic kidney disease, she will benefit from parenteral iron therapy.  She may proceed with her Feraheme today.  4.  Cough: -She did not have any significant hemoptysis in the last 2 weeks.  We have given her Tussionex which has helped with her cough.      Orders placed this encounter:  No orders of the defined types were placed in this encounter.     Derek Jack, MD [place] [phone]

## 2017-09-01 NOTE — Progress Notes (Signed)
1020 Labs reviewed with and pt seen by Dr. Delton Coombes today and chemo tx to be held due to low WBC and ANC per MD. Pt to return this Friday for labs and possible Neupogen injection per MD. Pt to bring in stool specimen for C. Diff Friday as well.                      Tasha [name] tolerated Feraheme infusion well without complaints or incident. VSS upon discharge. Pt discharged self ambulatory in satisfactory condition accompanied by her husband

## 2017-09-01 NOTE — Assessment & Plan Note (Signed)
1.  Clinical stage III (T4N1) right lung poorly differentiated adenocarcinoma, EGFR negative: -Patient underwent a CT scan of the chest for work-up of kidney transplant at [facility] and was found to have spiculated solid nodule in the posterior right lower lobe measuring 1.6 x 1.2 cm with fullness in the right hilum.  On 06/15/2011 she underwent EBUS guided biopsy.  7S lymph node was negative for malignancy.  Right hilar mass was positive for carcinoma.  Endobronchial biopsy showed poorly differentiated adenocarcinoma.  She also had endobronchial tumor debulked (75% obstructing reduced to 25% obstructing). BI had tumor 1.5 cm distal to right upper lobe takeoff.  Full EBUS staging shows no adenopathy at 11 L, 10 L, 4L or 4R.  Left mainstem and segments clear.  Right upper lobe normal.  - Her hemoptysis has improved since she stopped taking Coumadin.  She takes aspirin daily.  She is on Coumadin for vasculitis and strokes. - PET CT scan dated 07/05/2017 shows 1.5 cm medial right lower lobe nodule, and right perihilar mass with no evidence of distant metastasis.  We also talked about MRI of the brain without contrast dated 07/07/2017 which did not show any intracranial metastasis. -She saw Dr. Roxan Hockey for opinion regarding surgery.  Dr. Roxan Hockey did not think she would be a surgical candidate.  He also thought the tumor is encroaching the mediastinum. - Hemoptysis has improved over the last few days.  Complains of occasional vague chest pain which is nagging in the center of the chest.  She also complained of pain in her right shoulder and has difficulty putting it up when she is having radiation therapy.  She reportedly had neck surgery with resulting chronic right shoulder pain in certain positions.  I have given a prescription for tramadol 50 mg to be taken an hour before the radiation.  She tried taking ibuprofen, but we are concerned about her kidney function. -She started radiation therapy on  08/18/2017.  She tolerated her first weekly dose of carboplatin and paclitaxel very well.  Her week 2 of treatment was on 08/25/2017.  Today her white count is low at 1.7 with ANC of 1000.  I would hold off on week 3 treatment today.  She may proceed with Feraheme infusion.  She had diarrhea for the last 24 hours, watery.  Will obtain a sample for C. difficile. -We plan to check her CBC with differential on Friday.  If it is severely low, will consider giving Neupogen after she gets radiation on Friday.  2.  CKD: She has stage V CKD.  Hence she was being worked up for kidney transplant which is put on hold at this time.  She follows up with Dr. Elita Quick in [place].  3.  Anemia: -We have done work-up for macrocytic anemia, with a normal I09, folic acid and TSH.  Ferritin was 236 and percent saturation was 29.  Given her chronic kidney disease, she will benefit from parenteral iron therapy.  She may proceed with her Feraheme today.  4.  Cough: -She did not have any significant hemoptysis in the last 2 weeks.  We have given her Tussionex which has helped with her cough.

## 2017-09-01 NOTE — Patient Instructions (Signed)
[facility] Discharge Instructions  Chemo tx held today. Received Feraheme infusion only. Follow-up as scheduled. Call clinic for any questions or concerns   Thank you for choosing [facility] at [facility] to provide your oncology and hematology care.  To afford each patient quality time with our provider, please arrive at least 15 minutes before your scheduled appointment time.   If you have a lab appointment with the [facility] please come in thru the  Main Entrance and check in at the main information desk  You need to re-schedule your appointment should you arrive 10 or more minutes late.  We strive to give you quality time with our providers, and arriving late affects you and other patients whose appointments are after yours.  Also, if you no show three or more times for appointments you may be dismissed from the clinic at the providers discretion.     Again, thank you for choosing [facility].  Our hope is that these requests will decrease the amount of time that you wait before being seen by our physicians.       _____________________________________________________________  Should you have questions after your visit to [facility], please contact our office at [phone] between the hours of 8:30 a.m. and 4:30 p.m.  Voicemails left after 4:30 p.m. will not be returned until the following business day.  For prescription refill requests, have your pharmacy contact our office.       Resources For Cancer Patients and their Caregivers ? American Cancer Society: Can assist with transportation, wigs, general needs, runs Look Good Feel Better.        530-018-6262 ? Cancer Care: Provides financial assistance, online support groups, medication/co-pay assistance.  1-800-813-HOPE (571)052-4124) ? [place] Assists [place] cancer patients and their families through emotional ,  educational and financial support.  (725) 632-4822 ? [place] DSS Where to apply for food stamps, Medicaid and utility assistance. 539-720-0823 ? RCATS: Transportation to medical appointments. 770-507-9559 ? Social Security Administration: May apply for disability if have a Stage IV cancer. 434-822-9038 [phone] [phone] ? [employer], Disability and Transit Services: Assists with nutrition, care and transit needs. [facility] Support Programs:   > Cancer Support Group  2nd Tuesday of the month 1pm-2pm, Journey Room   > Creative Journey  3rd Tuesday of the month 1130am-1pm, Journey Room

## 2017-09-02 DIAGNOSIS — Z5111 Encounter for antineoplastic chemotherapy: Secondary | ICD-10-CM | POA: Diagnosis not present

## 2017-09-03 ENCOUNTER — Inpatient Hospital Stay (HOSPITAL_COMMUNITY): Payer: Managed Care, Other (non HMO)

## 2017-09-03 DIAGNOSIS — C3431 Malignant neoplasm of lower lobe, right bronchus or lung: Secondary | ICD-10-CM

## 2017-09-03 LAB — C DIFFICILE QUICK SCREEN W PCR REFLEX
C Diff antigen: NEGATIVE
C Diff interpretation: NOT DETECTED
C Diff toxin: NEGATIVE

## 2017-09-03 NOTE — Progress Notes (Signed)
Nutrition Assessment  Reason for Assessment: Consult    ASSESSMENT:  52 year old female with stage III lung cancer.  Discovered while being worked up for kidney transplant.  Past medical history of stage V CKD not on dialysis per patient, anemia, CAD, HLD, HTN, COPD, folic acid deficency, graves disease, fe deficiency anemia Patient currently receiving chemotherapy and radiation therapy.    Met with patient this pm.  Patient reports she has never been a big eater her whole life.  "My mother was a small person." Reports that she primarily cooks and her husband does not eat with her which makes her not eat or eat less.  " I don't want to eat by myself, it is not enjoyable." He finds every excuse in the book to not come and eat with me."  Asked patient several times what she normally eats in typical day.  Patient not specific in intake.  Reports yesterday ate 1/3rd of fish/shrimp, coleslaw and potato from restaurant.  Also reports ate few bites of oatmeal for breakfast yesterday am.  Reports drinking boost/ensure maybe 1 and half per day.  Also reports that she drink smoothies she makes with whey protein, fruit and whole milk and yogurt (about 8 oz serving daily).    Reports no nausea, having loose stool currently (3 per day), stool sample to be tested for c-diff today.  No other nutrition impact symptoms reported other than fatigue  Reports that she continues to make urine.  Not sure how much she makes per day.    Reports that nephrologist told her to restrict fluids due to low sodium. Thinks she should only drink about 36-48 oz per day.   Wt Readings from Last 10 Encounters:  09/01/17 83 lb 12.4 oz (38 kg)  08/25/17 86 lb 12.8 oz (39.4 kg)  08/25/17 86 lb 12.8 oz (39.4 kg)  08/19/17 88 lb (39.9 kg)  08/18/17 87 lb 14.4 oz (39.9 kg)  07/30/17 88 lb 6.4 oz (40.1 kg)  07/13/17 89 lb 12.8 oz (40.7 kg)  07/08/17 89 lb 12.8 oz (40.7 kg)  06/30/17 86 lb 6.4 oz (39.2 kg)  11/18/16 94 lb 6.4 oz  (42.8 kg)     MEDICATIONS: fe, folic acid, compazine, na bicarbonate   LABS: Na 123, BUN 17, creatinine 4.80 Patient does not know if phosphorus has been checked.   ANTHROPOMETRICS: Height:  Ht Readings from Last 1 Encounters:  07/13/17 _0  (1.549 m)   Weight:  Wt Readings from Last 1 Encounters:  09/01/17 83 lb 12.4 oz (38 kg)   BMI:  BMI Readings from Last 1 Encounters:  09/01/17 15.83 kg/m   UBW: Reports 95-110 lb (noted per chart last at 96 lb on 06/10/16)  14% weight loss in 1 year and 3 months 3% weight loss in 2 months, not significant  ESTIMATED ENERGY NEEDS:  Kcal: 1200-1350 calories Protein:60-68 g Fluid: 1.3 L/d   NUTRITION - FOCUSED PHYSICAL EXAM: deferred    NUTRITION DIAGNOSIS:  Inadequate oral intake related to cancer and cancer related treatments, social / environmental circumstances as evidenced by per patient/family report.   DOCUMENTATION CODES:   likely but unable to determine at this time   INTERVENTION:   Discussed importance of good nutrition during treatment.   Encouraged high calorie, high protein foods at this time with current treatment regimen and BMI of 15.   Encouraged high calorie shake and samples given of shake today with coupons. Contact information given.  RD wanted to set up  follow-up appointment with patient but declined at this time.  Encouraged patient to reach out with questions   GOAL:  Other (Comment)(weight maintenance)   MONITOR:  PO intake, Weight trends   Next Visit: Patient declined RD follow-up appointment.  Encouraged patient to reach out to RD at a later date if needed   Cru Kritikos B. Zenia Resides, [place], [place] Registered Dietitian [phone] (pager)

## 2017-09-08 ENCOUNTER — Inpatient Hospital Stay (HOSPITAL_COMMUNITY): Payer: Managed Care, Other (non HMO)

## 2017-09-08 ENCOUNTER — Inpatient Hospital Stay (HOSPITAL_BASED_OUTPATIENT_CLINIC_OR_DEPARTMENT_OTHER): Payer: Managed Care, Other (non HMO) | Admitting: Hematology

## 2017-09-08 ENCOUNTER — Encounter (HOSPITAL_COMMUNITY): Payer: Self-pay | Admitting: Hematology

## 2017-09-08 ENCOUNTER — Other Ambulatory Visit: Payer: Self-pay

## 2017-09-08 VITALS — BP 158/63 | HR 77 | Temp 98.2°F | Resp 18

## 2017-09-08 DIAGNOSIS — R042 Hemoptysis: Secondary | ICD-10-CM

## 2017-09-08 DIAGNOSIS — D649 Anemia, unspecified: Secondary | ICD-10-CM

## 2017-09-08 DIAGNOSIS — C3431 Malignant neoplasm of lower lobe, right bronchus or lung: Secondary | ICD-10-CM

## 2017-09-08 DIAGNOSIS — I120 Hypertensive chronic kidney disease with stage 5 chronic kidney disease or end stage renal disease: Secondary | ICD-10-CM

## 2017-09-08 DIAGNOSIS — C3402 Malignant neoplasm of left main bronchus: Secondary | ICD-10-CM | POA: Diagnosis not present

## 2017-09-08 DIAGNOSIS — E611 Iron deficiency: Secondary | ICD-10-CM

## 2017-09-08 DIAGNOSIS — R05 Cough: Secondary | ICD-10-CM

## 2017-09-08 DIAGNOSIS — N185 Chronic kidney disease, stage 5: Secondary | ICD-10-CM

## 2017-09-08 DIAGNOSIS — D539 Nutritional anemia, unspecified: Secondary | ICD-10-CM

## 2017-09-08 DIAGNOSIS — Z5111 Encounter for antineoplastic chemotherapy: Secondary | ICD-10-CM | POA: Diagnosis not present

## 2017-09-08 LAB — CBC WITH DIFFERENTIAL/PLATELET
Basophils Absolute: 0 10*3/uL (ref 0.0–0.1)
Basophils Relative: 1 %
EOS ABS: 0.2 10*3/uL (ref 0.0–0.7)
Eosinophils Relative: 5 %
HEMATOCRIT: 20.5 % — AB (ref 36.0–46.0)
HEMOGLOBIN: 7.2 g/dL — AB (ref 12.0–15.0)
LYMPHS ABS: 0.7 10*3/uL (ref 0.7–4.0)
Lymphocytes Relative: 21 %
MCH: 35.1 pg — AB (ref 26.0–34.0)
MCHC: 35.1 g/dL (ref 30.0–36.0)
MCV: 100 fL (ref 78.0–100.0)
Monocytes Absolute: 0.4 10*3/uL (ref 0.1–1.0)
Monocytes Relative: 13 %
NEUTROS ABS: 2.2 10*3/uL (ref 1.7–7.7)
NEUTROS PCT: 60 %
Platelets: 99 10*3/uL — ABNORMAL LOW (ref 150–400)
RBC: 2.05 MIL/uL — ABNORMAL LOW (ref 3.87–5.11)
RDW: 12.3 % (ref 11.5–15.5)
WBC: 3.5 10*3/uL — ABNORMAL LOW (ref 4.0–10.5)

## 2017-09-08 LAB — COMPREHENSIVE METABOLIC PANEL
ALK PHOS: 46 U/L (ref 38–126)
ALT: 10 U/L (ref 0–44)
ANION GAP: 10 (ref 5–15)
AST: 26 U/L (ref 15–41)
Albumin: 3 g/dL — ABNORMAL LOW (ref 3.5–5.0)
BILIRUBIN TOTAL: 0.3 mg/dL (ref 0.3–1.2)
BUN: 53 mg/dL — ABNORMAL HIGH (ref 6–20)
CALCIUM: 8.9 mg/dL (ref 8.9–10.3)
CO2: 23 mmol/L (ref 22–32)
Chloride: 91 mmol/L — ABNORMAL LOW (ref 98–111)
Creatinine, Ser: 3.91 mg/dL — ABNORMAL HIGH (ref 0.44–1.00)
GFR, EST AFRICAN AMERICAN: 14 mL/min — AB (ref >60)
GFR, EST NON AFRICAN AMERICAN: 12 mL/min — AB (ref >60)
Glucose, Bld: 95 mg/dL (ref 70–99)
Potassium: 4.3 mmol/L (ref 3.5–5.1)
Sodium: 124 mmol/L — ABNORMAL LOW (ref 135–145)
TOTAL PROTEIN: 6 g/dL — AB (ref 6.5–8.1)

## 2017-09-08 LAB — ABO/RH: ABO/RH(D): A POS

## 2017-09-08 LAB — PREPARE RBC (CROSSMATCH)

## 2017-09-08 MED ORDER — DIPHENHYDRAMINE HCL 25 MG PO CAPS
25.0000 mg | ORAL_CAPSULE | Freq: Once | ORAL | Status: AC
  Filled 2017-09-08: qty 1

## 2017-09-08 MED ORDER — SODIUM CHLORIDE 0.9 % IV SOLN
20.0000 mg | Freq: Once | INTRAVENOUS | Status: AC
  Administered 2017-09-08: 20 mg via INTRAVENOUS
  Filled 2017-09-08: qty 2

## 2017-09-08 MED ORDER — HYDROCOD POLST-CPM POLST ER 10-8 MG/5ML PO SUER: 5.0000 mL | Freq: Two times a day (BID) | ORAL | 0 refills | Status: DC | PRN

## 2017-09-08 MED ORDER — PALONOSETRON HCL INJECTION 0.25 MG/5ML
0.2500 mg | Freq: Once | INTRAVENOUS | Status: AC
  Administered 2017-09-08: 0.25 mg via INTRAVENOUS
  Filled 2017-09-08: qty 5

## 2017-09-08 MED ORDER — DIPHENHYDRAMINE HCL 50 MG/ML IJ SOLN
INTRAMUSCULAR | Status: AC
  Filled 2017-09-08: qty 1

## 2017-09-08 MED ORDER — SODIUM CHLORIDE 0.9 % IV SOLN
45.0000 mg/m2 | Freq: Once | INTRAVENOUS | Status: AC
  Administered 2017-09-08: 60 mg via INTRAVENOUS
  Filled 2017-09-08: qty 10

## 2017-09-08 MED ORDER — SODIUM CHLORIDE 0.9 % IV SOLN
70.8000 mg | Freq: Once | INTRAVENOUS | Status: AC
  Administered 2017-09-08: 70 mg via INTRAVENOUS
  Filled 2017-09-08: qty 7

## 2017-09-08 MED ORDER — FAMOTIDINE IN NACL 20-0.9 MG/50ML-% IV SOLN
20.0000 mg | Freq: Once | INTRAVENOUS | Status: AC
  Administered 2017-09-08: 20 mg via INTRAVENOUS
  Filled 2017-09-08: qty 50

## 2017-09-08 MED ORDER — HEPARIN SOD (PORK) LOCK FLUSH 100 UNIT/ML IV SOLN
INTRAVENOUS | Status: AC
  Filled 2017-09-08: qty 5

## 2017-09-08 MED ORDER — SODIUM CHLORIDE 0.9 % IV SOLN
250.0000 mL | Freq: Once | INTRAVENOUS | Status: AC
  Administered 2017-09-08: 250 mL via INTRAVENOUS

## 2017-09-08 MED ORDER — DIPHENHYDRAMINE HCL 50 MG/ML IJ SOLN: 50.0000 mg | Freq: Once | INTRAMUSCULAR | Status: DC

## 2017-09-08 MED ORDER — DIPHENHYDRAMINE HCL 50 MG/ML IJ SOLN
25.0000 mg | Freq: Once | INTRAMUSCULAR | Status: AC
  Administered 2017-09-08: 25 mg via INTRAVENOUS

## 2017-09-08 MED ORDER — SODIUM CHLORIDE 0.9% FLUSH
10.0000 mL | INTRAVENOUS | Status: AC | PRN
  Administered 2017-09-08: 10 mL

## 2017-09-08 MED ORDER — HEPARIN SOD (PORK) LOCK FLUSH 100 UNIT/ML IV SOLN
500.0000 [IU] | Freq: Every day | INTRAVENOUS | Status: AC | PRN
  Administered 2017-09-08: 500 [IU]
  Filled 2017-09-08: qty 5

## 2017-09-08 MED ORDER — ACETAMINOPHEN 325 MG PO TABS
650.0000 mg | ORAL_TABLET | Freq: Once | ORAL | Status: AC
  Administered 2017-09-08: 650 mg via ORAL
  Filled 2017-09-08: qty 2

## 2017-09-08 MED ORDER — SODIUM CHLORIDE 0.9 % IV SOLN
Freq: Once | INTRAVENOUS | Status: AC
  Administered 2017-09-08: 10:00:00 via INTRAVENOUS

## 2017-09-08 MED ORDER — SODIUM CHLORIDE 0.9 % IV SOLN: INTRAVENOUS | Status: DC

## 2017-09-08 NOTE — Patient Instructions (Signed)
[facility] Discharge Instructions for Patients Receiving Chemotherapy   Beginning January 23rd 2017 lab work for the [facility] will be done in the  Main lab at [facility] on 1st floor. If you have a lab appointment with the [facility] please come in thru the  Main Entrance and check in at the main information desk   Today you received the following chemotherapy agents Taxol and Carboplatin as well as 1unit of PRBC's. Follow-up as scheduled. Call clinic for any questions or concerns  To help prevent nausea and vomiting after your treatment, we encourage you to take your nausea medication   If you develop nausea and vomiting, or diarrhea that is not controlled by your medication, call the clinic.  The clinic phone number is [phone]. Office hours are Monday-Friday 8:30am-5:00pm.  BELOW ARE SYMPTOMS THAT SHOULD BE REPORTED IMMEDIATELY:  *FEVER GREATER THAN 101.0 F  *CHILLS WITH OR WITHOUT FEVER  NAUSEA AND VOMITING THAT IS NOT CONTROLLED WITH YOUR NAUSEA MEDICATION  *UNUSUAL SHORTNESS OF BREATH  *UNUSUAL BRUISING OR BLEEDING  TENDERNESS IN MOUTH AND THROAT WITH OR WITHOUT PRESENCE OF ULCERS  *URINARY PROBLEMS  *BOWEL PROBLEMS  UNUSUAL RASH Items with * indicate a potential emergency and should be followed up as soon as possible. If you have an emergency after office hours please contact your primary care physician or go to the nearest emergency department.  Please call the clinic during office hours if you have any questions or concerns.   You may also contact the Patient Navigator at [phone] should you have any questions or need assistance in obtaining follow up care.      Resources For Cancer Patients and their Caregivers ? American Cancer Society: Can assist with transportation, wigs, general needs, runs Look Good Feel Better.        ([phone] ? Cancer Care: Provides financial assistance, online support groups,  medication/co-pay assistance.  1-800-813-HOPE (704)170-1076) ? [place] Assists [place] cancer patients and their families through emotional , educational and financial support.  712 010 7564 ? [place] DSS Where to apply for food stamps, Medicaid and utility assistance. (434)571-4260 ? RCATS: Transportation to medical appointments. (407)203-5549 ? Social Security Administration: May apply for disability if have a Stage IV cancer. 707-116-0956 432-205-4600 ? [employer], Disability and Transit Services: Assists with nutrition, care and transit needs. 6502198774

## 2017-09-08 NOTE — Progress Notes (Signed)
Tasha [name], [place] [address]   CLINIC:  Medical Oncology/Hematology  PCP:  Glenda Chroman, MD [place] [address] [phone]   REASON FOR VISIT:  Follow-up for stage III lung cancer  CURRENT THERAPY: Chemoradaition therapy with Carboplatin and paclitaxel  BRIEF ONCOLOGIC HISTORY:    Malignant neoplasm of lung ([facility])   06/30/2017 Initial Diagnosis    Malignant neoplasm of bronchus and lung ([place])      08/15/2017 -  Chemotherapy    The patient had palonosetron (ALOXI) injection 0.25 mg, 0.25 mg, Intravenous,  Once, 3 of 4 cycles Administration: 0.25 mg (08/18/2017), 0.25 mg (08/25/2017) CARBOplatin (PARAPLATIN) 70 mg in sodium chloride 0.9 % 100 mL chemo infusion, 70 mg (100 % of original dose 67.6 mg), Intravenous,  Once, 3 of 4 cycles Dose modification:   (original dose 67.6 mg, Cycle 1),   (original dose 68.6 mg, Cycle 2),   (original dose 70.8 mg, Cycle 3) Administration: 70 mg (08/18/2017), 70 mg (08/25/2017) PACLitaxel (TAXOL) 60 mg in sodium chloride 0.9 % 150 mL chemo infusion (</= 23m/m2), 45 mg/m2 = 60 mg, Intravenous,  Once, 3 of 4 cycles Administration: 60 mg (08/18/2017), 60 mg (08/25/2017)  for chemotherapy treatment.        INTERVAL HISTORY:  [name]57y.o. female returns for routine follow-up stage III lung cancer. Patient is here today with her husband. She feels great today with not issues. Her diarrhea has stopped and her energy levels are higher than before. She has been coughing up blood which started yesterday. Her appetite remains the same at 50% however she is drinking 2 cans of ensure daily. She denies any new pains. Denies any fevers and infections recently. Denies any nausea, vomiting, diarrhea. Overall the patient feels good and ready for treatment today.     REVIEW OF SYSTEMS:  Review of Systems  Constitutional: Negative.   HENT:  Negative.   Eyes: Negative.   Respiratory: Positive for hemoptysis.     Cardiovascular: Negative.   Gastrointestinal: Negative.   Endocrine: Negative.   Genitourinary: Negative.    Musculoskeletal: Negative.   Skin: Negative.   Neurological: Negative.   Hematological: Negative.   Psychiatric/Behavioral: Negative.      PAST MEDICAL/SURGICAL HISTORY:  Past Medical History:  Diagnosis Date  . Anemia   . Aortic insufficiency   . Asthma   . Breast nodule   . [facility]otid artery disease ([facility]   . 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 CKD (chronic kidney disease) stage 4, GFR 15-29 ml/min (HCC)   . COPD (chronic obstructive pulmonary disease) ([facility]   . CRI (chronic renal insufficiency)   . Folic acid deficiency   . Glaucoma   . Graves disease   . Hyperlipidemia   . Hypertension   . Iron deficiency   . Lung cancer ([facility]    non-small cell right lung  . Recurrent UTI   . Renal cyst   . Systemic vasculitis ([facility]    Not otherwise specified, Dr. KSonny Dandy  Past Surgical History:  Procedure Laterality Date  . BRONCHOSCOPY    . COLONOSCOPY    . ORIF ORBITAL FRACTURE  1996   Left  . PORTACATH PLACEMENT Left 08/25/2017   Procedure: INSERTION PORT-A-CATH;  Surgeon: JAviva Signs MD;  Location: [facility];  Service: General;  Laterality: Left;  . SPINAL FUSION  1994   At C3-5     SOCIAL HISTORY:  Social History   Socioeconomic History  . Marital status: Married    Spouse name: Not on file  . Number of children: Not on file  . Years of education: Not on file  . Highest education level: Not on file  Occupational History  . Not on file  Social Needs  . Financial resource strain: Not on file  . Food insecurity:    Worry: Not on file    Inability: Not on file  . Transportation needs:    Medical: Not on file    Non-medical: Not on file  Tobacco Use  . Smoking status: Former Smoker    Years: 25.00    Types: Cigarettes    Last attempt to quit: 02/24/2008    Years since quitting: 9.5  . Smokeless tobacco: Never Used   Substance and Sexual Activity  . Alcohol use: Not Currently  . Drug use: No  . Sexual activity: Not on file  Lifestyle  . Physical activity:    Days per week: Not on file    Minutes per session: Not on file  . Stress: Not on file  Relationships  . Social connections:    Talks on phone: Not on file    Gets together: Not on file    Attends religious service: Not on file    Active member of club or organization: Not on file    Attends meetings of clubs or organizations: Not on file    Relationship status: Not on file  . Intimate partner violence:    Fear of current or ex partner: Not on file    Emotionally abused: Not on file    Physically abused: Not on file    Forced sexual activity: Not on file  Other Topics Concern  . Not on file  Social History Narrative   No regular exercise.    FAMILY HISTORY:  Family History  Problem Relation Age of Onset  . Heart attack Father   . Diverticulitis Mother   . Fibromyalgia Mother   . Hypertension Brother     CURRENT MEDICATIONS:  Outpatient Encounter Medications as of 09/08/2017  Medication Sig  . albuterol (PROAIR HFA) 108 (90 BASE) MCG/ACT inhaler Inhale 2 puffs into the lungs every 4 (four) hours as needed for wheezing or shortness of breath.   Marland Kitchen amLODipine (NORVASC) 10 MG tablet Take 10 mg by mouth daily.  Marland Kitchen aspirin EC 325 MG tablet Take 162.5-325 mg by mouth See admin instructions. Take 325 mg by mouth every other day alternating with 162.5 mg by mouth every other day  . calcitRIOL (ROCALTROL) 0.25 MCG capsule Take 0.25 mcg by mouth daily.   . chlorpheniramine-HYDROcodone (TUSSIONEX) 10-8 MG/5ML SUER Take 5 mLs by mouth every 12 (twelve) hours as needed for cough.  . ferrous sulfate 325 (65 FE) MG tablet Take 325 mg by mouth daily.    . folic acid (FOLVITE) 1 MG tablet Take 1 mg by mouth daily.    Marland Kitchen HYDROcodone-acetaminophen (NORCO) 5-325 MG tablet Take 1 tablet by mouth every 6 (six) hours as needed for moderate pain.  Marland Kitchen  lidocaine-prilocaine (EMLA) cream Apply a quarter size amount to port site 1 hour prior to chemo. Do not rub in. Cover with plastic wrap.  . metoprolol tartrate (LOPRESSOR) 25 MG tablet Take 25 mg by mouth 2 (two) times daily.   . prochlorperazine (COMPAZINE) 10 MG tablet Take 1 tablet (10 mg total) by mouth every 6 (six) hours as needed for nausea or vomiting.  Marland Kitchen  sodium bicarbonate 650 MG tablet Take 650 mg by mouth 2 (two) times daily.   Marland Kitchen warfarin (COUMADIN) 5 MG tablet    No facility-administered encounter medications on file as of 09/08/2017.     ALLERGIES:  Allergies  Allergen Reactions  . Sulfonamide Derivatives Rash     PHYSICAL EXAM:  ECOG Performance status: 1  Vitals:   09/08/17 0913  BP: (!) 128/50  Pulse: 73  Resp: 16  Temp: (!) 97.3 F (36.3 C)  SpO2: 100%   Filed Weights   09/08/17 0913  Weight: 83 lb 15.9 oz (38.1 kg)    Physical Exam   LABORATORY DATA:  I have reviewed the labs as listed.  CBC    Component Value Date/Time   WBC 3.5 (L) 09/08/2017 0822   RBC 2.05 (L) 09/08/2017 0822   HGB 7.2 (L) 09/08/2017 0822   HCT 20.5 (L) 09/08/2017 0822   PLT 99 (L) 09/08/2017 0822   MCV 100.0 09/08/2017 0822   MCH 35.1 (H) 09/08/2017 0822   MCHC 35.1 09/08/2017 0822   RDW 12.3 09/08/2017 0822   LYMPHSABS 0.7 09/08/2017 0822   MONOABS 0.4 09/08/2017 0822   EOSABS 0.2 09/08/2017 0822   BASOSABS 0.0 09/08/2017 0822   CMP Latest Ref Rng & Units 09/08/2017 09/01/2017 08/25/2017  Glucose 70 - 99 mg/dL 95 94 114(H)  BUN 6 - 20 mg/dL 53(H) 71(H) 60(H)  Creatinine 0.44 - 1.00 mg/dL 3.91(H) 4.80(H) 4.40(H)  Sodium 135 - 145 mmol/L 124(L) 123(L) 122(L)  Potassium 3.5 - 5.1 mmol/L 4.3 3.9 4.4  Chloride 98 - 111 mmol/L 91(L) 90(L) 87(L)  CO2 22 - 32 mmol/L 23 17(L) 20(L)  Calcium 8.9 - 10.3 mg/dL 8.9 8.4(L) 8.7(L)  Total Protein 6.5 - 8.1 g/dL 6.0(L) 6.6 6.6  Total Bilirubin 0.3 - 1.2 mg/dL 0.3 0.4 0.3  Alkaline Phos 38 - 126 U/L 46 40 47  AST 15 - 41 U/L 26 18  19   ALT 0 - 44 U/L 10 6 10        ASSESSMENT & PLAN:   Malignant neoplasm of lung (HCC) 1.  Clinical stage III (T4N1) right lung poorly differentiated adenocarcinoma, EGFR negative: -Patient underwent a CT scan of the chest for work-up of kidney transplant at [facility] and was found to have spiculated solid nodule in the posterior right lower lobe measuring 1.6 x 1.2 cm with fullness in the right hilum.  On 06/15/2011 she underwent EBUS guided biopsy.  7S lymph node was negative for malignancy.  Right hilar mass was positive for carcinoma.  Endobronchial biopsy showed poorly differentiated adenocarcinoma.  She also had endobronchial tumor debulked (75% obstructing reduced to 25% obstructing). BI had tumor 1.5 cm distal to right upper lobe takeoff.  Full EBUS staging shows no adenopathy at 11 L, 10 L, 4L or 4R.  Left mainstem and segments clear.  Right upper lobe normal.  - Her hemoptysis has improved since she stopped taking Coumadin.  She takes aspirin daily.  She is on Coumadin for vasculitis and strokes. - PET CT scan dated 07/05/2017 shows 1.5 cm medial right lower lobe nodule, and right perihilar mass with no evidence of distant metastasis.  We also talked about MRI of the brain without contrast dated 07/07/2017 which did not show any intracranial metastasis. -She saw Dr. Roxan Hockey for opinion regarding surgery.  Dr. Roxan Hockey did not think she would be a surgical candidate.  He also thought the tumor is encroaching the mediastinum. - Hemoptysis has improved over the last  few days.  Complains of occasional vague chest pain which is nagging in the center of the chest.  She also complained of pain in her right shoulder and has difficulty putting it up when she is having radiation therapy.  She reportedly had neck surgery with resulting chronic right shoulder pain in certain positions.  I have given a prescription for tramadol 50 mg to be taken an hour before the radiation.  She tried taking  ibuprofen, but we are concerned about her kidney function. -She started radiation therapy on 08/18/2017.  She tolerated her first weekly dose of carboplatin and paclitaxel very well.  Her week 2 of treatment was on 08/25/2017.  Her week 3 treatment on 09/01/2017 was held due to low white count.  She also had diarrhea which resolved.  C. difficile was negative. - Today her white count improved to 3.5 and ANC more than 1500.  However her platelet count is 99.  She may proceed with week 3 of treatment today.  We will check her blood counts again next week.  2.  CKD: She has stage V CKD.  Hence she was being worked up for kidney transplant which is put on hold at this time.  She follows up with Dr. Elita Quick in [place].  3.  Anemia: -We have done work-up for macrocytic anemia, with a normal F81, folic acid and TSH.  Ferritin was 236 and percent saturation was 29.  Given her chronic kidney disease, she will benefit from parenteral iron therapy.  She received her first Feraheme infusion on 09/01/2017.  However her hemoglobin dropped to 7.2 today.  She will receive 1 unit of blood transfusion.   4.  Cough: - She is taking Tussionex which is helping her cough.  Hemoptysis is continuing to improve.       Orders placed this encounter:  Orders Placed This Encounter  Procedures  . CBC with Differential/Platelet  . Comprehensive metabolic panel      Derek Jack, MD [place] [phone]

## 2017-09-08 NOTE — Addendum Note (Signed)
Addended by: Glennie Isle on: 09/08/2017 04:12 PM   Modules accepted: Orders

## 2017-09-08 NOTE — Progress Notes (Signed)
[id number] Labs reviewed with and pt seen by Dr. Delton Coombes and pt approved for chemo tx today along with 1 unit of blood for transfusion today with Feraheme infusion to be held per MD                            Tasha [name] tolerated chemo tx and blood transfusion well without complaints or incident. VSS upon discharge. Pt discharged self ambulatory in satisfactory condition accompanied by her husband

## 2017-09-08 NOTE — Assessment & Plan Note (Signed)
1.  Clinical stage III (T4N1) right lung poorly differentiated adenocarcinoma, EGFR negative: -Patient underwent a CT scan of the chest for work-up of kidney transplant at [facility] and was found to have spiculated solid nodule in the posterior right lower lobe measuring 1.6 x 1.2 cm with fullness in the right hilum.  On 06/15/2011 she underwent EBUS guided biopsy.  7S lymph node was negative for malignancy.  Right hilar mass was positive for carcinoma.  Endobronchial biopsy showed poorly differentiated adenocarcinoma.  She also had endobronchial tumor debulked (75% obstructing reduced to 25% obstructing). BI had tumor 1.5 cm distal to right upper lobe takeoff.  Full EBUS staging shows no adenopathy at 11 L, 10 L, 4L or 4R.  Left mainstem and segments clear.  Right upper lobe normal.  - Her hemoptysis has improved since she stopped taking Coumadin.  She takes aspirin daily.  She is on Coumadin for vasculitis and strokes. - PET CT scan dated 07/05/2017 shows 1.5 cm medial right lower lobe nodule, and right perihilar mass with no evidence of distant metastasis.  We also talked about MRI of the brain without contrast dated 07/07/2017 which did not show any intracranial metastasis. -She saw Dr. Roxan Hockey for opinion regarding surgery.  Dr. Roxan Hockey did not think she would be a surgical candidate.  He also thought the tumor is encroaching the mediastinum. - Hemoptysis has improved over the last few days.  Complains of occasional vague chest pain which is nagging in the center of the chest.  She also complained of pain in her right shoulder and has difficulty putting it up when she is having radiation therapy.  She reportedly had neck surgery with resulting chronic right shoulder pain in certain positions.  I have given a prescription for tramadol 50 mg to be taken an hour before the radiation.  She tried taking ibuprofen, but we are concerned about her kidney function. -She started radiation therapy on  08/18/2017.  She tolerated her first weekly dose of carboplatin and paclitaxel very well.  Her week 2 of treatment was on 08/25/2017.  Her week 3 treatment on 09/01/2017 was held due to low white count.  She also had diarrhea which resolved.  C. difficile was negative. - Today her white count improved to 3.5 and ANC more than 1500.  However her platelet count is 99.  She may proceed with week 3 of treatment today.  We will check her blood counts again next week.  2.  CKD: She has stage V CKD.  Hence she was being worked up for kidney transplant which is put on hold at this time.  She follows up with Dr. Elita Quick in [place].  3.  Anemia: -We have done work-up for macrocytic anemia, with a normal Q59, folic acid and TSH.  Ferritin was 236 and percent saturation was 29.  Given her chronic kidney disease, she will benefit from parenteral iron therapy.  She received her first Feraheme infusion on 09/01/2017.  However her hemoglobin dropped to 7.2 today.  She will receive 1 unit of blood transfusion.   4.  Cough: - She is taking Tussionex which is helping her cough.  Hemoptysis is continuing to improve.

## 2017-09-09 ENCOUNTER — Telehealth (HOSPITAL_COMMUNITY): Payer: Self-pay | Admitting: General Practice

## 2017-09-09 LAB — TYPE AND SCREEN
ABO/RH(D): A POS
Antibody Screen: NEGATIVE
Unit division: 0

## 2017-09-09 LAB — BPAM RBC
BLOOD PRODUCT EXPIRATION DATE: 201908112359
ISSUE DATE / TIME: 201907171346
Unit Type and Rh: 6200

## 2017-09-09 NOTE — Telephone Encounter (Signed)
[facility] CSW Progress Notes  Referral received from NP, patient is newly diagnosed w cancer, husband is due to lose current job due to company relocation.  Husband has Stage IV carcinoid cancer in liver, beginning in colon.  Patient has CKD, was candidate for kidney transplant - cancer was found on chest X-ray.  Patient and spouse have applied for disability [place] at [facility] in [place]).  Staff message sent to [employer]ocate as patient is concerned about affording treatment as spouse's job is ending soon and they will lose insurance. Will send information on [place], also referred patient to Cancer [facility] and suggested they apply for small grant available to help w living expenses.  Will call patient next week to discuss additional options.  CSW available to meet w patient and husband on Tuesday, will discuss option of referral to chaplain if they are unable to come to [facility] on Tuesdays.  Edwyna Shell, LCSW Clinical Social Worker Phone:  [phone]

## 2017-09-13 ENCOUNTER — Telehealth (HOSPITAL_COMMUNITY): Payer: Self-pay | Admitting: Hematology

## 2017-09-13 NOTE — Telephone Encounter (Signed)
Called to check status of appeal for [phone]. Per Aundria Rud appeal was not recvd. Left msg with appeals dept for a return call.

## 2017-09-15 ENCOUNTER — Other Ambulatory Visit: Payer: Self-pay

## 2017-09-15 ENCOUNTER — Encounter (HOSPITAL_COMMUNITY): Payer: Self-pay | Admitting: Hematology

## 2017-09-15 ENCOUNTER — Telehealth (HOSPITAL_COMMUNITY): Payer: Self-pay | Admitting: Hematology

## 2017-09-15 ENCOUNTER — Inpatient Hospital Stay (HOSPITAL_COMMUNITY): Payer: Managed Care, Other (non HMO)

## 2017-09-15 ENCOUNTER — Inpatient Hospital Stay (HOSPITAL_BASED_OUTPATIENT_CLINIC_OR_DEPARTMENT_OTHER): Payer: Managed Care, Other (non HMO) | Admitting: Hematology

## 2017-09-15 VITALS — BP 143/69 | HR 74 | Temp 97.7°F | Resp 18 | Wt 84.8 lb

## 2017-09-15 DIAGNOSIS — R042 Hemoptysis: Secondary | ICD-10-CM

## 2017-09-15 DIAGNOSIS — E875 Hyperkalemia: Secondary | ICD-10-CM

## 2017-09-15 DIAGNOSIS — C3431 Malignant neoplasm of lower lobe, right bronchus or lung: Secondary | ICD-10-CM

## 2017-09-15 DIAGNOSIS — C3401 Malignant neoplasm of right main bronchus: Secondary | ICD-10-CM

## 2017-09-15 DIAGNOSIS — N185 Chronic kidney disease, stage 5: Secondary | ICD-10-CM

## 2017-09-15 DIAGNOSIS — I120 Hypertensive chronic kidney disease with stage 5 chronic kidney disease or end stage renal disease: Secondary | ICD-10-CM

## 2017-09-15 DIAGNOSIS — R05 Cough: Secondary | ICD-10-CM | POA: Diagnosis not present

## 2017-09-15 DIAGNOSIS — E611 Iron deficiency: Secondary | ICD-10-CM

## 2017-09-15 DIAGNOSIS — Z5111 Encounter for antineoplastic chemotherapy: Secondary | ICD-10-CM | POA: Diagnosis not present

## 2017-09-15 DIAGNOSIS — D649 Anemia, unspecified: Secondary | ICD-10-CM

## 2017-09-15 LAB — CBC WITH DIFFERENTIAL/PLATELET
Basophils Absolute: 0 10*3/uL (ref 0.0–0.1)
Basophils Relative: 0 %
EOS PCT: 3 %
Eosinophils Absolute: 0.1 10*3/uL (ref 0.0–0.7)
HEMATOCRIT: 29.3 % — AB (ref 36.0–46.0)
Hemoglobin: 10.2 g/dL — ABNORMAL LOW (ref 12.0–15.0)
LYMPHS ABS: 0.5 10*3/uL — AB (ref 0.7–4.0)
Lymphocytes Relative: 18 %
MCH: 32.8 pg (ref 26.0–34.0)
MCHC: 34.8 g/dL (ref 30.0–36.0)
MCV: 94.2 fL (ref 78.0–100.0)
MONO ABS: 0 10*3/uL — AB (ref 0.1–1.0)
MONOS PCT: 1 %
NEUTROS ABS: 2.1 10*3/uL (ref 1.7–7.7)
Neutrophils Relative %: 78 %
PLATELETS: 40 10*3/uL — AB (ref 150–400)
RBC: 3.11 MIL/uL — ABNORMAL LOW (ref 3.87–5.11)
RDW: 15.6 % — AB (ref 11.5–15.5)
WBC: 2.7 10*3/uL — ABNORMAL LOW (ref 4.0–10.5)

## 2017-09-15 LAB — COMPREHENSIVE METABOLIC PANEL
ALT: 13 U/L (ref 0–44)
AST: 21 U/L (ref 15–41)
Albumin: 3.2 g/dL — ABNORMAL LOW (ref 3.5–5.0)
Alkaline Phosphatase: 52 U/L (ref 38–126)
Anion gap: 13 (ref 5–15)
BILIRUBIN TOTAL: 0.5 mg/dL (ref 0.3–1.2)
BUN: 76 mg/dL — AB (ref 6–20)
CHLORIDE: 92 mmol/L — AB (ref 98–111)
CO2: 21 mmol/L — ABNORMAL LOW (ref 22–32)
CREATININE: 4.42 mg/dL — AB (ref 0.44–1.00)
Calcium: 9.3 mg/dL (ref 8.9–10.3)
GFR, EST AFRICAN AMERICAN: 12 mL/min — AB (ref >60)
GFR, EST NON AFRICAN AMERICAN: 10 mL/min — AB (ref >60)
Glucose, Bld: 99 mg/dL (ref 70–99)
POTASSIUM: 5.5 mmol/L — AB (ref 3.5–5.1)
Sodium: 126 mmol/L — ABNORMAL LOW (ref 135–145)
TOTAL PROTEIN: 6.3 g/dL — AB (ref 6.5–8.1)

## 2017-09-15 MED ORDER — SODIUM POLYSTYRENE SULFONATE 15 GM/60ML PO SUSP
60.0000 g | Freq: Once | ORAL | Status: DC
  Filled 2017-09-15: qty 240

## 2017-09-15 MED ORDER — SODIUM POLYSTYRENE SULFONATE 15 GM/60ML PO SUSP
45.0000 g | Freq: Once | ORAL | Status: AC
  Administered 2017-09-15: 45 g via ORAL
  Filled 2017-09-15: qty 180

## 2017-09-15 NOTE — Patient Instructions (Signed)
[place] at [facility]  Discharge Instructions:  Take the kayexalate as directed.  _______________________________________________________________  Thank you for choosing [place] at [facility] to provide your oncology and hematology care.  To afford each patient quality time with our providers, please arrive at least 15 minutes before your scheduled appointment.  You need to re-schedule your appointment if you arrive 10 or more minutes late.  We strive to give you quality time with our providers, and arriving late affects you and other patients whose appointments are after yours.  Also, if you no show three or more times for appointments you may be dismissed from the clinic.  Again, thank you for choosing [place] at [place] hope is that these requests will allow you access to exceptional care and in a timely manner. _______________________________________________________________  If you have questions after your visit, please contact our office at [phone] between the hours of 8:30 a.m. and 5:00 p.m. Voicemails left after 4:30 p.m. will not be returned until the following business day. _______________________________________________________________  For prescription refill requests, have your pharmacy contact our office. _______________________________________________________________  Recommendations made by the consultant and any test results will be sent to your referring physician. _______________________________________________________________

## 2017-09-15 NOTE — Telephone Encounter (Signed)
Spoke with both Mr and Mrs Rocks regarding job loss,cobra and pending disability. Both are in the process of filing for disability. Advised them of drug replacement programs, in house cancer fund and etc. They will have med coverage until November and then have the option of cobra which is around $1500/mo.

## 2017-09-15 NOTE — Progress Notes (Signed)
 [facility] [address] [place], [place] [address]   CLINIC:  Medical Oncology/Hematology  PCP:  Vyas, Dhruv B, MD [address] [place] [address] [phone]   REASON FOR VISIT:  Follow-up for stage III lung cancer  CURRENT THERAPY: Chemoradation with carboplatin and paclitaxel  BRIEF ONCOLOGIC HISTORY:    Malignant neoplasm of lung (HCC)   06/30/2017 Initial Diagnosis    Malignant neoplasm of bronchus and lung (HCC)      08/15/2017 -  Chemotherapy    The patient had palonosetron (ALOXI) injection 0.25 mg, 0.25 mg, Intravenous,  Once, 3 of 4 cycles Administration: 0.25 mg (08/18/2017), 0.25 mg (08/25/2017), 0.25 mg (09/08/2017) CARBOplatin (PARAPLATIN) 70 mg in sodium chloride 0.9 % 100 mL chemo infusion, 70 mg (100 % of original dose 67.6 mg), Intravenous,  Once, 3 of 4 cycles Dose modification:   (original dose 67.6 mg, Cycle 1),   (original dose 68.6 mg, Cycle 2),   (original dose 70.8 mg, Cycle 3) Administration: 70 mg (08/18/2017), 70 mg (08/25/2017), 70 mg (09/08/2017) PACLitaxel (TAXOL) 60 mg in sodium chloride 0.9 % 150 mL chemo infusion (</= 80mg/m2), 45 mg/m2 = 60 mg, Intravenous,  Once, 3 of 4 cycles Administration: 60 mg (08/18/2017), 60 mg (08/25/2017), 60 mg (09/08/2017)  for chemotherapy treatment.         INTERVAL HISTORY:  [name] 52 y.o. female returns for routine follow-up for stage III lung cancer and consideration for next cycle of chemotherapy. Patient is here today with her husband. She states she is doing well. She states radiation is going well. She has lost her hair. Denies any hemoptysis. She still has a slight cough but it is mucus only. She has been maintaining her weight and drinking her ensure 2 cans daily. Her taste buds are still good and has had no problems swallowing. She does get fatigued during the day off and on. Denies any new pains. Denies any nausea, vomiting, diarrhea.      REVIEW OF SYSTEMS:  Review of Systems    Constitutional: Positive for fatigue.  HENT:  Negative.   Eyes: Negative.   Respiratory: Positive for cough.   Cardiovascular: Negative.   Gastrointestinal: Negative.   Endocrine: Negative.   Genitourinary: Negative.    Musculoskeletal: Negative.   Skin: Negative.   Neurological: Negative.   Hematological: Negative.   Psychiatric/Behavioral: Negative.      PAST MEDICAL/SURGICAL HISTORY:  Past Medical History:  Diagnosis Date  . Anemia   . Aortic insufficiency   . Asthma   . Breast nodule   . Carotid artery disease (HCC)   . 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 CKD (chronic kidney disease) stage 4, GFR 15-29 ml/min (HCC)   . COPD (chronic obstructive pulmonary disease) (HCC)   . CRI (chronic renal insufficiency)   . Folic acid deficiency   . Glaucoma   . Graves disease   . Hyperlipidemia   . Hypertension   . Iron deficiency   . Lung cancer (HCC)    non-small cell right lung  . Recurrent UTI   . Renal cyst   . Systemic vasculitis (HCC)    Not otherwise specified, Dr. Karb   Past Surgical History:  Procedure Laterality Date  . BRONCHOSCOPY    . COLONOSCOPY    . ORIF ORBITAL FRACTURE  1996   Left  . PORTACATH PLACEMENT Left 08/25/2017   Procedure: INSERTION PORT-A-CATH;  Surgeon: Jenkins, Mark, MD;  Location:   [facility];  Service: General;  Laterality: Left;  . SPINAL FUSION  1994   At C3-5     SOCIAL HISTORY:  Social History   Socioeconomic History  . Marital status: Married    Spouse name: Not on file  . Number of children: Not on file  . Years of education: Not on file  . Highest education level: Not on file  Occupational History  . Not on file  Social Needs  . Financial resource strain: Not on file  . Food insecurity:    Worry: Not on file    Inability: Not on file  . Transportation needs:    Medical: Not on file    Non-medical: Not on file  Tobacco Use  . Smoking status: Former Smoker    Years: 25.00     Types: Cigarettes    Last attempt to quit: 02/24/2008    Years since quitting: 9.5  . Smokeless tobacco: Never Used  Substance and Sexual Activity  . Alcohol use: Not Currently  . Drug use: No  . Sexual activity: Not on file  Lifestyle  . Physical activity:    Days per week: Not on file    Minutes per session: Not on file  . Stress: Not on file  Relationships  . Social connections:    Talks on phone: Not on file    Gets together: Not on file    Attends religious service: Not on file    Active member of club or organization: Not on file    Attends meetings of clubs or organizations: Not on file    Relationship status: Not on file  . Intimate partner violence:    Fear of current or ex partner: Not on file    Emotionally abused: Not on file    Physically abused: Not on file    Forced sexual activity: Not on file  Other Topics Concern  . Not on file  Social History Narrative   No regular exercise.    FAMILY HISTORY:  Family History  Problem Relation Age of Onset  . Heart attack Father   . Diverticulitis Mother   . Fibromyalgia Mother   . Hypertension Brother     CURRENT MEDICATIONS:  Outpatient Encounter Medications as of 09/15/2017  Medication Sig  . albuterol (PROAIR HFA) 108 (90 BASE) MCG/ACT inhaler Inhale 2 puffs into the lungs every 4 (four) hours as needed for wheezing or shortness of breath.   Marland Kitchen amLODipine (NORVASC) 10 MG tablet Take 10 mg by mouth daily.  Marland Kitchen aspirin EC 325 MG tablet Take 162.5-325 mg by mouth See admin instructions. Take 325 mg by mouth every other day alternating with 162.5 mg by mouth every other day  . calcitRIOL (ROCALTROL) 0.25 MCG capsule Take 0.25 mcg by mouth daily.   . chlorpheniramine-HYDROcodone (TUSSIONEX) 10-8 MG/5ML SUER Take 5 mLs by mouth every 12 (twelve) hours as needed for cough.  . ferrous sulfate 325 (65 FE) MG tablet Take 325 mg by mouth daily.    . folic acid (FOLVITE) 1 MG tablet Take 1 mg by mouth daily.    Marland Kitchen  HYDROcodone-acetaminophen (NORCO) 5-325 MG tablet Take 1 tablet by mouth every 6 (six) hours as needed for moderate pain.  Marland Kitchen lidocaine-prilocaine (EMLA) cream Apply a quarter size amount to port site 1 hour prior to chemo. Do not rub in. Cover with plastic wrap.  . metoprolol tartrate (LOPRESSOR) 25 MG tablet Take 25 mg by mouth 2 (two) times daily.   Marland Kitchen  prochlorperazine (COMPAZINE) 10 MG tablet Take 1 tablet (10 mg total) by mouth every 6 (six) hours as needed for nausea or vomiting.  . sodium bicarbonate 650 MG tablet Take 650 mg by mouth 2 (two) times daily.   . warfarin (COUMADIN) 5 MG tablet    Facility-Administered Encounter Medications as of 09/15/2017  Medication  . sodium polystyrene (KAYEXALATE) 15 GM/60ML suspension 60 g    ALLERGIES:  Allergies  Allergen Reactions  . Sulfonamide Derivatives Rash     PHYSICAL EXAM:  ECOG Performance status: 1  Vitals:   09/15/17 0933  BP: (!) 143/69  Pulse: 74  Resp: 18  Temp: 97.7 F (36.5 C)  SpO2: 100%   Filed Weights   09/15/17 0933  Weight: 84 lb 12.8 oz (38.5 kg)    Physical Exam   LABORATORY DATA:  I have reviewed the labs as listed.  CBC    Component Value Date/Time   WBC 2.7 (L) 09/15/2017 0916   RBC 3.11 (L) 09/15/2017 0916   HGB 10.2 (L) 09/15/2017 0916   HCT 29.3 (L) 09/15/2017 0916   PLT 40 (L) 09/15/2017 0916   MCV 94.2 09/15/2017 0916   MCH 32.8 09/15/2017 0916   MCHC 34.8 09/15/2017 0916   RDW 15.6 (H) 09/15/2017 0916   LYMPHSABS 0.5 (L) 09/15/2017 0916   MONOABS 0.0 (L) 09/15/2017 0916   EOSABS 0.1 09/15/2017 0916   BASOSABS 0.0 09/15/2017 0916   CMP Latest Ref Rng & Units 09/15/2017 09/08/2017 09/01/2017  Glucose 70 - 99 mg/dL 99 95 94  BUN 6 - 20 mg/dL 76(H) 53(H) 71(H)  Creatinine 0.44 - 1.00 mg/dL 4.42(H) 3.91(H) 4.80(H)  Sodium 135 - 145 mmol/L 126(L) 124(L) 123(L)  Potassium 3.5 - 5.1 mmol/L 5.5(H) 4.3 3.9  Chloride 98 - 111 mmol/L 92(L) 91(L) 90(L)  CO2 22 - 32 mmol/L 21(L) 23 17(L)    Calcium 8.9 - 10.3 mg/dL 9.3 8.9 8.4(L)  Total Protein 6.5 - 8.1 g/dL 6.3(L) 6.0(L) 6.6  Total Bilirubin 0.3 - 1.2 mg/dL 0.5 0.3 0.4  Alkaline Phos 38 - 126 U/L 52 46 40  AST 15 - 41 U/L 21 26 18  ALT 0 - 44 U/L 13 10 6         ASSESSMENT & PLAN:   Malignant neoplasm of lung (HCC) 1.  Clinical stage III (T4N1) right lung poorly differentiated adenocarcinoma, EGFR negative: -Patient underwent a CT scan of the chest for work-up of kidney transplant at [facility] and was found to have spiculated solid nodule in the posterior right lower lobe measuring 1.6 x 1.2 cm with fullness in the right hilum.  On 06/15/2011 she underwent EBUS guided biopsy.  7S lymph node was negative for malignancy.  Right hilar mass was positive for carcinoma.  Endobronchial biopsy showed poorly differentiated adenocarcinoma.  She also had endobronchial tumor debulked (75% obstructing reduced to 25% obstructing). BI had tumor 1.5 cm distal to right upper lobe takeoff.  Full EBUS staging shows no adenopathy at 11 L, 10 L, 4L or 4R.  Left mainstem and segments clear.  Right upper lobe normal.  - Her hemoptysis has improved since she stopped taking Coumadin.  She takes aspirin daily.  She is on Coumadin for vasculitis and strokes. - PET CT scan dated 07/05/2017 shows 1.5 cm medial right lower lobe nodule, and right perihilar mass with no evidence of distant metastasis.  We also talked about MRI of the brain without contrast dated 07/07/2017 which did not show any intracranial metastasis. -  She saw Dr. Hendrickson for opinion regarding surgery.  Dr. Hendrickson did not think she would be a surgical candidate.  He also thought the tumor is encroaching the mediastinum. - Hemoptysis has improved over the last few days.  Complains of occasional vague chest pain which is nagging in the center of the chest.  She also complained of pain in her right shoulder and has difficulty putting it up when she is having radiation therapy.  She  reportedly had neck surgery with resulting chronic right shoulder pain in certain positions.  I have given a prescription for tramadol 50 mg to be taken an hour before the radiation.  She tried taking ibuprofen, but we are concerned about her kidney function. -She started radiation therapy on 08/18/2017.  She tolerated her first weekly dose of carboplatin and paclitaxel very well.  Her week 2 of treatment was on 08/25/2017.  Her week 3 treatment on 09/01/2017 was held due to low white count.  She also had diarrhea which resolved.  C. difficile was negative. - She received week 3 on 09/08/2017.  Today her platelet count is low at 40.  Denies any excessive bleeding.  I would hold off her chemotherapy until next week.  She has a potassium of 5.5 today.  She will be given Kayexalate 60 g today.  I will see her back next week.  2.  CKD: She has stage V CKD.  Hence she was being worked up for kidney transplant which is put on hold at this time.  She follows up with Dr. Vish in [place].  3.  Anemia: -We have done work-up for macrocytic anemia, with a normal B12, folic acid and TSH.  Ferritin was 236 and percent saturation was 29.  Given her chronic kidney disease, she will benefit from parenteral iron therapy.  She received her first Feraheme infusion on 09/01/2017.  Hemoglobin is 10.2 today.  4.  Cough: - She is taking Tussionex which is helping her cough.  This is causing some constipation.  I have suggested her to take stool softener.       Orders placed this encounter:  Orders Placed This Encounter  Procedures  . CBC with Differential/Platelet  . Comprehensive metabolic panel      Sreedhar Katragadda, MD  [facility] [phone]  

## 2017-09-15 NOTE — Progress Notes (Signed)
No treatment today due to lab work per oncologist.  Patient to be rescheduled for next week and to be given Kayexalate today.    Patient given kayexalate and instructed by Dr. Delton Coombes to wait until she arrived home to take the medication.  Reviewed with the patient the instructions with understanding verbalized.  Discharged with no complaints and no s/s of distress noted.

## 2017-09-15 NOTE — Assessment & Plan Note (Signed)
1.  Clinical stage III (T4N1) right lung poorly differentiated adenocarcinoma, EGFR negative: -Patient underwent a CT scan of the chest for work-up of kidney transplant at [facility] and was found to have spiculated solid nodule in the posterior right lower lobe measuring 1.6 x 1.2 cm with fullness in the right hilum.  On 06/15/2011 she underwent EBUS guided biopsy.  7S lymph node was negative for malignancy.  Right hilar mass was positive for carcinoma.  Endobronchial biopsy showed poorly differentiated adenocarcinoma.  She also had endobronchial tumor debulked (75% obstructing reduced to 25% obstructing). BI had tumor 1.5 cm distal to right upper lobe takeoff.  Full EBUS staging shows no adenopathy at 11 L, 10 L, 4L or 4R.  Left mainstem and segments clear.  Right upper lobe normal.  - Her hemoptysis has improved since she stopped taking Coumadin.  She takes aspirin daily.  She is on Coumadin for vasculitis and strokes. - PET CT scan dated 07/05/2017 shows 1.5 cm medial right lower lobe nodule, and right perihilar mass with no evidence of distant metastasis.  We also talked about MRI of the brain without contrast dated 07/07/2017 which did not show any intracranial metastasis. -She saw Dr. Roxan Hockey for opinion regarding surgery.  Dr. Roxan Hockey did not think she would be a surgical candidate.  He also thought the tumor is encroaching the mediastinum. - Hemoptysis has improved over the last few days.  Complains of occasional vague chest pain which is nagging in the center of the chest.  She also complained of pain in her right shoulder and has difficulty putting it up when she is having radiation therapy.  She reportedly had neck surgery with resulting chronic right shoulder pain in certain positions.  I have given a prescription for tramadol 50 mg to be taken an hour before the radiation.  She tried taking ibuprofen, but we are concerned about her kidney function. -She started radiation therapy on  08/18/2017.  She tolerated her first weekly dose of carboplatin and paclitaxel very well.  Her week 2 of treatment was on 08/25/2017.  Her week 3 treatment on 09/01/2017 was held due to low white count.  She also had diarrhea which resolved.  C. difficile was negative. - She received week 3 on 09/08/2017.  Today her platelet count is low at 40.  Denies any excessive bleeding.  I would hold off her chemotherapy until next week.  She has a potassium of 5.5 today.  She will be given Kayexalate 60 g today.  I will see her back next week.  2.  CKD: She has stage V CKD.  Hence she was being worked up for kidney transplant which is put on hold at this time.  She follows up with Dr. Elita Quick in [place].  3.  Anemia: -We have done work-up for macrocytic anemia, with a normal A21, folic acid and TSH.  Ferritin was 236 and percent saturation was 29.  Given her chronic kidney disease, she will benefit from parenteral iron therapy.  She received her first Feraheme infusion on 09/01/2017.  Hemoglobin is 10.2 today.  4.  Cough: - She is taking Tussionex which is helping her cough.  This is causing some constipation.  I have suggested her to take stool softener.

## 2017-09-15 NOTE — Patient Instructions (Addendum)
[place] at [facility] Discharge Instructions  Please follow up in one week with labs and treatment.   Today you saw Dr. Raliegh Ip.   Thank you for choosing [place] at [facility] to provide your oncology and hematology care.  To afford each patient quality time with our provider, please arrive at least 15 minutes before your scheduled appointment time.   If you have a lab appointment with the [facility] please come in thru the  Main Entrance and check in at the main information desk  You need to re-schedule your appointment should you arrive 10 or more minutes late.  We strive to give you quality time with our providers, and arriving late affects you and other patients whose appointments are after yours.  Also, if you no show three or more times for appointments you may be dismissed from the clinic at the providers discretion.     Again, thank you for choosing [facility].  Our hope is that these requests will decrease the amount of time that you wait before being seen by our physicians.       _____________________________________________________________  Should you have questions after your visit to [facility], please contact our office at [phone] between the hours of 8:30 a.m. and 4:30 p.m.  Voicemails left after 4:30 p.m. will not be returned until the following business day.  For prescription refill requests, have your pharmacy contact our office.       Resources For Cancer Patients and their Caregivers ? American Cancer Society: Can assist with transportation, wigs, general needs, runs Look Good Feel Better.        828-310-3824 ? Cancer Care: Provides financial assistance, online support groups, medication/co-pay assistance.  1-800-813-HOPE (856)297-3528) ? [facility] Assists [place] cancer patients and their families through emotional , educational and financial support.   (848)221-8759 ? [place] DSS Where to apply for food stamps, Medicaid and utility assistance. (312)764-8450 ? [facility]: Transportation to medical appointments. 224-623-3141 ? Social Security Administration: May apply for disability if have a Stage IV cancer. 772-513-8692 321-276-9091 ? [employer], Disability and Transit Services: Assists with nutrition, care and transit needs. [place] Support Programs:   > Cancer Support Group  2nd Tuesday of the month 1pm-2pm, Journey Room   > Creative Journey  3rd Tuesday of the month 1130am-1pm, Journey Room

## 2017-09-22 ENCOUNTER — Other Ambulatory Visit: Payer: Self-pay

## 2017-09-22 ENCOUNTER — Inpatient Hospital Stay (HOSPITAL_BASED_OUTPATIENT_CLINIC_OR_DEPARTMENT_OTHER): Payer: Managed Care, Other (non HMO) | Admitting: Hematology

## 2017-09-22 ENCOUNTER — Encounter (HOSPITAL_COMMUNITY): Payer: Self-pay | Admitting: Hematology

## 2017-09-22 ENCOUNTER — Inpatient Hospital Stay (HOSPITAL_COMMUNITY): Payer: Managed Care, Other (non HMO)

## 2017-09-22 VITALS — BP 123/59 | HR 79 | Temp 97.7°F | Resp 16 | Wt 84.3 lb

## 2017-09-22 VITALS — BP 169/69 | HR 91 | Temp 98.7°F | Resp 16

## 2017-09-22 DIAGNOSIS — C3490 Malignant neoplasm of unspecified part of unspecified bronchus or lung: Secondary | ICD-10-CM

## 2017-09-22 DIAGNOSIS — C3401 Malignant neoplasm of right main bronchus: Secondary | ICD-10-CM | POA: Diagnosis not present

## 2017-09-22 DIAGNOSIS — T451X5A Adverse effect of antineoplastic and immunosuppressive drugs, initial encounter: Principal | ICD-10-CM

## 2017-09-22 DIAGNOSIS — D6481 Anemia due to antineoplastic chemotherapy: Secondary | ICD-10-CM

## 2017-09-22 DIAGNOSIS — Z5111 Encounter for antineoplastic chemotherapy: Secondary | ICD-10-CM | POA: Diagnosis not present

## 2017-09-22 DIAGNOSIS — C3431 Malignant neoplasm of lower lobe, right bronchus or lung: Secondary | ICD-10-CM

## 2017-09-22 DIAGNOSIS — N184 Chronic kidney disease, stage 4 (severe): Secondary | ICD-10-CM

## 2017-09-22 LAB — CBC WITH DIFFERENTIAL/PLATELET
BASOS PCT: 0 %
Basophils Absolute: 0 10*3/uL (ref 0.0–0.1)
EOS ABS: 0.1 10*3/uL (ref 0.0–0.7)
EOS PCT: 6 %
HCT: 17.7 % — ABNORMAL LOW (ref 36.0–46.0)
HEMOGLOBIN: 6.1 g/dL — AB (ref 12.0–15.0)
Lymphocytes Relative: 34 %
Lymphs Abs: 0.3 10*3/uL (ref 0.7–4.0)
MCH: 32.8 pg (ref 26.0–34.0)
MCHC: 34.5 g/dL (ref 30.0–36.0)
MCV: 95.2 fL (ref 78.0–100.0)
MONOS PCT: 18 %
Monocytes Absolute: 0.2 10*3/uL (ref 0.1–1.0)
NEUTROS PCT: 42 %
Neutro Abs: 0.4 10*3/uL (ref 1.7–7.7)
PLATELETS: 188 10*3/uL (ref 150–400)
RBC: 1.86 MIL/uL — ABNORMAL LOW (ref 3.87–5.11)
RDW: 15.5 % (ref 11.5–15.5)
WBC: 1 10*3/uL — AB (ref 4.0–10.5)

## 2017-09-22 LAB — COMPREHENSIVE METABOLIC PANEL
ALK PHOS: 55 U/L (ref 38–126)
ALT: 12 U/L (ref 0–44)
AST: 20 U/L (ref 15–41)
Albumin: 3.1 g/dL — ABNORMAL LOW (ref 3.5–5.0)
Anion gap: 11 (ref 5–15)
BILIRUBIN TOTAL: 0.3 mg/dL (ref 0.3–1.2)
BUN: 63 mg/dL — AB (ref 6–20)
CO2: 26 mmol/L (ref 22–32)
Calcium: 9.3 mg/dL (ref 8.9–10.3)
Chloride: 92 mmol/L — ABNORMAL LOW (ref 98–111)
Creatinine, Ser: 3.99 mg/dL — ABNORMAL HIGH (ref 0.44–1.00)
GFR, EST AFRICAN AMERICAN: 14 mL/min — AB (ref >60)
GFR, EST NON AFRICAN AMERICAN: 12 mL/min — AB (ref >60)
Glucose, Bld: 98 mg/dL (ref 70–99)
Potassium: 4.7 mmol/L (ref 3.5–5.1)
Sodium: 129 mmol/L — ABNORMAL LOW (ref 135–145)
TOTAL PROTEIN: 6.2 g/dL — AB (ref 6.5–8.1)

## 2017-09-22 LAB — PREPARE RBC (CROSSMATCH)

## 2017-09-22 MED ORDER — DIPHENHYDRAMINE HCL 25 MG PO CAPS
25.0000 mg | ORAL_CAPSULE | Freq: Once | ORAL | Status: AC
  Administered 2017-09-22: 25 mg via ORAL
  Filled 2017-09-22: qty 1

## 2017-09-22 MED ORDER — HEPARIN SOD (PORK) LOCK FLUSH 100 UNIT/ML IV SOLN
500.0000 [IU] | Freq: Every day | INTRAVENOUS | Status: AC | PRN
  Administered 2017-09-22: 500 [IU]
  Filled 2017-09-22: qty 5

## 2017-09-22 MED ORDER — SODIUM CHLORIDE 0.9% IV SOLUTION
250.0000 mL | Freq: Once | INTRAVENOUS | Status: AC
  Administered 2017-09-22: 250 mL via INTRAVENOUS

## 2017-09-22 MED ORDER — FILGRASTIM 300 MCG/0.5ML IJ SOSY
300.0000 ug | PREFILLED_SYRINGE | Freq: Once | INTRAMUSCULAR | Status: AC
  Administered 2017-09-22: 300 ug via SUBCUTANEOUS
  Filled 2017-09-22: qty 0.5

## 2017-09-22 MED ORDER — ACETAMINOPHEN 325 MG PO TABS
650.0000 mg | ORAL_TABLET | Freq: Once | ORAL | Status: AC
  Administered 2017-09-22: 650 mg via ORAL
  Filled 2017-09-22: qty 2

## 2017-09-22 MED ORDER — GI COCKTAIL ~~LOC~~
30.0000 mL | Freq: Once | ORAL | Status: AC
  Administered 2017-09-22: 30 mL via ORAL
  Filled 2017-09-22: qty 30

## 2017-09-22 NOTE — Assessment & Plan Note (Signed)
1.  Clinical stage III (T4N1) right lung poorly differentiated adenocarcinoma, EGFR negative: -Patient underwent a CT scan of the chest for work-up of kidney transplant at [facility] and was found to have spiculated solid nodule in the posterior right lower lobe measuring 1.6 x 1.2 cm with fullness in the right hilum.  On 06/15/2011 she underwent EBUS guided biopsy.  7S lymph node was negative for malignancy.  Right hilar mass was positive for carcinoma.  Endobronchial biopsy showed poorly differentiated adenocarcinoma.  She also had endobronchial tumor debulked (75% obstructing reduced to 25% obstructing). BI had tumor 1.5 cm distal to right upper lobe takeoff.  Full EBUS staging shows no adenopathy at 11 L, 10 L, 4L or 4R.  Left mainstem and segments clear.  Right upper lobe normal.  - Her hemoptysis has improved since she stopped taking Coumadin.  She takes aspirin daily.  She is on Coumadin for vasculitis and strokes. - PET CT scan dated 07/05/2017 shows 1.5 cm medial right lower lobe nodule, and right perihilar mass with no evidence of distant metastasis.  We also talked about MRI of the brain without contrast dated 07/07/2017 which did not show any intracranial metastasis. -She saw Dr. Roxan Hockey for opinion regarding surgery.  Dr. Roxan Hockey did not think she would be a surgical candidate.  He also thought the tumor is encroaching the mediastinum. - Hemoptysis has improved over the last few days.  Complains of occasional vague chest pain which is nagging in the center of the chest.  She also complained of pain in her right shoulder and has difficulty putting it up when she is having radiation therapy.  She reportedly had neck surgery with resulting chronic right shoulder pain in certain positions.  I have given a prescription for tramadol 50 mg to be taken an hour before the radiation.  She tried taking ibuprofen, but we are concerned about her kidney function. -She started radiation therapy on  08/18/2017.  She tolerated her first weekly dose of carboplatin and paclitaxel very well.  Her week 2 of treatment was on 08/25/2017.  Her week 3 treatment on 09/01/2017 was held due to low white count.  She also had diarrhea which resolved.  C. difficile was negative. - She received week 3 on 09/08/2017. -Her last week treatment was held because of low platelet count of 40,000.  We have reviewed her blood work today.  Her white count is low at 1.0 and ANC of 400.  We will hold off on treatment today.  I have called Dr.Yanagihara and informed him of the low blood counts.  Will be holding radiation therapy tomorrow.  I have called her insurance company to get authorization for Neupogen.  She will receive 300 mcg of Neupogen today.  In the future if she needs any further growth factor, she will be receiving Granix.  2.  CKD: She has stage V CKD.  Hence she was being worked up for kidney transplant which is put on hold at this time.  She follows up with Dr. Elita Quick in [place].  3.  Anemia: -We have done work-up for macrocytic anemia, with a normal J82, folic acid and TSH.  Ferritin was 236 and percent saturation was 29.  Given her chronic kidney disease, she will benefit from parenteral iron therapy.  She received her first Feraheme infusion on 09/01/2017.  Hemoglobin today dropped to 6.1.  She will receive 2 units of blood transfusion.  4.  Cough: - She is taking Tussionex which is helping  her cough.  This is causing some constipation.  She will continue stool softener.

## 2017-09-22 NOTE — Progress Notes (Signed)
CRITICAL VALUE ALERT Critical value received:  WBC: 1.0, hgb 6.1 Date of notification:  09-22-2017 Time of notification: 0925 Critical value read back:  Yes.   Nurse who received alert:  Forest Gleason RN MD notified (1st page):  Dr. Delton Coombes  No treatment today. Will do labs on Friday and possible treatment on Monday. Will given 2 units of blood today and neupagen injection per Dr. Delton Coombes.    Patient tolerated it well without problems. Vitals stable and discharged home from clinic ambulatory. Follow up as scheduled.

## 2017-09-22 NOTE — Patient Instructions (Signed)
[place] at [facility] Discharge Instructions  No treatment today. neupagen and 2 units of blood given Labs on Friday.  Follow up as scheduled.   Thank you for choosing [place] at [facility] to provide your oncology and hematology care.  To afford each patient quality time with our provider, please arrive at least 15 minutes before your scheduled appointment time.   If you have a lab appointment with the [facility] please come in thru the  Main Entrance and check in at the main information desk  You need to re-schedule your appointment should you arrive 10 or more minutes late.  We strive to give you quality time with our providers, and arriving late affects you and other patients whose appointments are after yours.  Also, if you no show three or more times for appointments you may be dismissed from the clinic at the providers discretion.     Again, thank you for choosing [facility].  Our hope is that these requests will decrease the amount of time that you wait before being seen by our physicians.       _____________________________________________________________  Should you have questions after your visit to [facility], please contact our office at [phone] between the hours of 8:00 a.m. and 4:30 p.m.  Voicemails left after 4:00 p.m. will not be returned until the following business day.  For prescription refill requests, have your pharmacy contact our office and allow 72 hours.    Cancer Center Support Programs:   > Cancer Support Group  2nd Tuesday of the month 1pm-2pm, Journey Room

## 2017-09-22 NOTE — Progress Notes (Signed)
[address], [place] [address]   CLINIC:  Medical Oncology/Hematology  PCP:  Glenda Chroman, MD [place] [address] [phone]   REASON FOR VISIT:  Follow-up for stage III lung cancer  CURRENT THERAPY: chemoradiation with carboplatin and paclitaxel  BRIEF ONCOLOGIC HISTORY:    Malignant neoplasm of lung ([place])   06/30/2017 Initial Diagnosis    Malignant neoplasm of bronchus and lung ([facility])      08/15/2017 -  Chemotherapy    The patient had palonosetron (ALOXI) injection 0.25 mg, 0.25 mg, Intravenous,  Once, 3 of 4 cycles Administration: 0.25 mg (08/18/2017), 0.25 mg (08/25/2017), 0.25 mg (09/08/2017) CARBOplatin (PARAPLATIN) 70 mg in sodium chloride 0.9 % 100 mL chemo infusion, 70 mg (100 % of original dose 67.6 mg), Intravenous,  Once, 3 of 4 cycles Dose modification:   (original dose 67.6 mg, Cycle 1),   (original dose 68.6 mg, Cycle 2),   (original dose 70.8 mg, Cycle 3) Administration: 70 mg (08/18/2017), 70 mg (08/25/2017), 70 mg (09/08/2017) PACLitaxel (TAXOL) 60 mg in sodium chloride 0.9 % 150 mL chemo infusion (</= 71m/m2), 45 mg/m2 = 60 mg, Intravenous,  Once, 3 of 4 cycles Administration: 60 mg (08/18/2017), 60 mg (08/25/2017), 60 mg (09/08/2017)  for chemotherapy treatment.          INTERVAL HISTORY:  Ms. BSwoboda89y.o. female returns for routine follow-up for lung cancer and consideration for next cycle of chemotherapy. Patient is here today with her husband. Patient has been more fatigued this week. She skipped radiation yesterday due to the fatigue. She has been dizzy and weak. She states she had slight numbness in her left fingertips but has improved since she skipped treatment last week. She is still drinking 2 cans of ensure daily to help maintian her weight. Denies nausea, vomiting, or diarrhea. Denies fevers, chills, or infections.     REVIEW OF SYSTEMS:  Review of Systems  Constitutional: Positive for fatigue.  HENT:   Negative.   Eyes: Negative.   Respiratory: Negative.   Cardiovascular: Negative.   Gastrointestinal: Negative.   Endocrine: Negative.   Genitourinary: Negative.    Musculoskeletal: Negative.   Skin: Negative.   Neurological: Positive for dizziness.  Hematological: Negative.   Psychiatric/Behavioral: Negative.      PAST MEDICAL/SURGICAL HISTORY:  Past Medical History:  Diagnosis Date  . Anemia   . Aortic insufficiency   . Asthma   . Breast nodule   . Carotid artery disease ([facility]   . 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 CKD (chronic kidney disease) stage 4, GFR 15-29 ml/min (HCC)   . COPD (chronic obstructive pulmonary disease) ([facility]   . CRI (chronic renal insufficiency)   . Folic acid deficiency   . Glaucoma   . Graves disease   . Hyperlipidemia   . Hypertension   . Iron deficiency   . Lung cancer ([facility]    non-small cell right lung  . Recurrent UTI   . Renal cyst   . Systemic vasculitis ([facility]    Not otherwise specified, Dr. KSonny Dandy  Past Surgical History:  Procedure Laterality Date  . BRONCHOSCOPY    . COLONOSCOPY    . ORIF ORBITAL FRACTURE  1996   Left  . PORTACATH PLACEMENT Left 08/25/2017   Procedure: INSERTION PORT-A-CATH;  Surgeon: JAviva Signs MD;  Location: [facility];  Service: General;  Laterality: Left;  . [place]  At C3-5     SOCIAL HISTORY:  Social History   Socioeconomic History  . Marital status: Married    Spouse name: Not on file  . Number of children: Not on file  . Years of education: Not on file  . Highest education level: Not on file  Occupational History  . Not on file  Social Needs  . Financial resource strain: Not on file  . Food insecurity:    Worry: Not on file    Inability: Not on file  . Transportation needs:    Medical: Not on file    Non-medical: Not on file  Tobacco Use  . Smoking status: Former Smoker    Years: 25.00    Types: Cigarettes    Last attempt to quit:  02/24/2008    Years since quitting: 9.5  . Smokeless tobacco: Never Used  Substance and Sexual Activity  . Alcohol use: Not Currently  . Drug use: No  . Sexual activity: Not on file  Lifestyle  . Physical activity:    Days per week: Not on file    Minutes per session: Not on file  . Stress: Not on file  Relationships  . Social connections:    Talks on phone: Not on file    Gets together: Not on file    Attends religious service: Not on file    Active member of club or organization: Not on file    Attends meetings of clubs or organizations: Not on file    Relationship status: Not on file  . Intimate partner violence:    Fear of current or ex partner: Not on file    Emotionally abused: Not on file    Physically abused: Not on file    Forced sexual activity: Not on file  Other Topics Concern  . Not on file  Social History Narrative   No regular exercise.    FAMILY HISTORY:  Family History  Problem Relation Age of Onset  . Heart attack Father   . Diverticulitis Mother   . Fibromyalgia Mother   . Hypertension Brother     CURRENT MEDICATIONS:  Outpatient Encounter Medications as of 09/22/2017  Medication Sig  . albuterol (PROAIR HFA) 108 (90 BASE) MCG/ACT inhaler Inhale 2 puffs into the lungs every 4 (four) hours as needed for wheezing or shortness of breath.   Marland Kitchen amLODipine (NORVASC) 10 MG tablet Take 10 mg by mouth daily.  Marland Kitchen aspirin EC 325 MG tablet Take 162.5-325 mg by mouth See admin instructions. Take 325 mg by mouth every other day alternating with 162.5 mg by mouth every other day  . calcitRIOL (ROCALTROL) 0.25 MCG capsule Take 0.25 mcg by mouth daily.   . chlorpheniramine-HYDROcodone (TUSSIONEX) 10-8 MG/5ML SUER Take 5 mLs by mouth every 12 (twelve) hours as needed for cough.  . ferrous sulfate 325 (65 FE) MG tablet Take 325 mg by mouth daily.    . folic acid (FOLVITE) 1 MG tablet Take 1 mg by mouth daily.    Marland Kitchen HYDROcodone-acetaminophen (NORCO) 5-325 MG tablet Take 1  tablet by mouth every 6 (six) hours as needed for moderate pain.  Marland Kitchen lidocaine-prilocaine (EMLA) cream Apply a quarter size amount to port site 1 hour prior to chemo. Do not rub in. Cover with plastic wrap.  . metoprolol tartrate (LOPRESSOR) 25 MG tablet Take 25 mg by mouth 2 (two) times daily.   . prochlorperazine (COMPAZINE) 10 MG tablet Take 1 tablet (10 mg total) by mouth every 6 (six)  hours as needed for nausea or vomiting.  . sodium bicarbonate 650 MG tablet Take 650 mg by mouth 2 (two) times daily.   Marland Kitchen warfarin (COUMADIN) 5 MG tablet    No facility-administered encounter medications on file as of 09/22/2017.     ALLERGIES:  Allergies  Allergen Reactions  . Sulfonamide Derivatives Rash     PHYSICAL EXAM:  ECOG Performance status: 1  Vitals:   09/22/17 0914  BP: (!) 123/59  Pulse: 79  Resp: 16  Temp: 97.7 F (36.5 C)  SpO2: 100%   Filed Weights   09/22/17 0914  Weight: 84 lb 4.8 oz (38.2 kg)    Physical Exam   LABORATORY DATA:  I have reviewed the labs as listed.  CBC    Component Value Date/Time   WBC 1.0 (LL) 09/22/2017 0836   RBC 1.86 (L) 09/22/2017 0836   HGB 6.1 (LL) 09/22/2017 0836   HCT 17.7 (L) 09/22/2017 0836   PLT 188 09/22/2017 0836   MCV 95.2 09/22/2017 0836   MCH 32.8 09/22/2017 0836   MCHC 34.5 09/22/2017 0836   RDW 15.5 09/22/2017 0836   LYMPHSABS 0.3 09/22/2017 0836   MONOABS 0.2 09/22/2017 0836   EOSABS 0.1 09/22/2017 0836   BASOSABS 0.0 09/22/2017 0836   CMP Latest Ref Rng & Units 09/22/2017 09/15/2017 09/08/2017  Glucose 70 - 99 mg/dL 98 99 95  BUN 6 - 20 mg/dL 63(H) 76(H) 53(H)  Creatinine 0.44 - 1.00 mg/dL 3.99(H) 4.42(H) 3.91(H)  Sodium 135 - 145 mmol/L 129(L) 126(L) 124(L)  Potassium 3.5 - 5.1 mmol/L 4.7 5.5(H) 4.3  Chloride 98 - 111 mmol/L 92(L) 92(L) 91(L)  CO2 22 - 32 mmol/L 26 21(L) 23  Calcium 8.9 - 10.3 mg/dL 9.3 9.3 8.9  Total Protein 6.5 - 8.1 g/dL 6.2(L) 6.3(L) 6.0(L)  Total Bilirubin 0.3 - 1.2 mg/dL 0.3 0.5 0.3    Alkaline Phos 38 - 126 U/L 55 52 46  AST 15 - 41 U/L 20 21 26   ALT 0 - 44 U/L 12 13 10        ASSESSMENT & PLAN:   Malignant neoplasm of lung (HCC) 1.  Clinical stage III (T4N1) right lung poorly differentiated adenocarcinoma, EGFR negative: -Patient underwent a CT scan of the chest for work-up of kidney transplant at [facility] and was found to have spiculated solid nodule in the posterior right lower lobe measuring 1.6 x 1.2 cm with fullness in the right hilum.  On 06/15/2011 she underwent EBUS guided biopsy.  7S lymph node was negative for malignancy.  Right hilar mass was positive for carcinoma.  Endobronchial biopsy showed poorly differentiated adenocarcinoma.  She also had endobronchial tumor debulked (75% obstructing reduced to 25% obstructing). BI had tumor 1.5 cm distal to right upper lobe takeoff.  Full EBUS staging shows no adenopathy at 11 L, 10 L, 4L or 4R.  Left mainstem and segments clear.  Right upper lobe normal.  - Her hemoptysis has improved since she stopped taking Coumadin.  She takes aspirin daily.  She is on Coumadin for vasculitis and strokes. - PET CT scan dated 07/05/2017 shows 1.5 cm medial right lower lobe nodule, and right perihilar mass with no evidence of distant metastasis.  We also talked about MRI of the brain without contrast dated 07/07/2017 which did not show any intracranial metastasis. -She saw Dr. Roxan Hockey for opinion regarding surgery.  Dr. Roxan Hockey did not think she would be a surgical candidate.  He also thought the tumor is encroaching the mediastinum. -  Hemoptysis has improved over the last few days.  Complains of occasional vague chest pain which is nagging in the center of the chest.  She also complained of pain in her right shoulder and has difficulty putting it up when she is having radiation therapy.  She reportedly had neck surgery with resulting chronic right shoulder pain in certain positions.  I have given a prescription for tramadol 50 mg to  be taken an hour before the radiation.  She tried taking ibuprofen, but we are concerned about her kidney function. -She started radiation therapy on 08/18/2017.  She tolerated her first weekly dose of carboplatin and paclitaxel very well.  Her week 2 of treatment was on 08/25/2017.  Her week 3 treatment on 09/01/2017 was held due to low white count.  She also had diarrhea which resolved.  C. difficile was negative. - She received week 3 on 09/08/2017. -Her last week treatment was held because of low platelet count of 40,000.  We have reviewed her blood work today.  Her white count is low at 1.0 and ANC of 400.  We will hold off on treatment today.  I have called Dr.Yanagihara and informed him of the low blood counts.  Will be holding radiation therapy tomorrow.  I have called her insurance company to get authorization for Neupogen.  She will receive 300 mcg of Neupogen today.  In the future if she needs any further growth factor, she will be receiving Granix.  2.  CKD: She has stage V CKD.  Hence she was being worked up for kidney transplant which is put on hold at this time.  She follows up with Dr. Elita Quick in [place].  3.  Anemia: -We have done work-up for macrocytic anemia, with a normal Z61, folic acid and TSH.  Ferritin was 236 and percent saturation was 29.  Given her chronic kidney disease, she will benefit from parenteral iron therapy.  She received her first Feraheme infusion on 09/01/2017.  Hemoglobin today dropped to 6.1.  She will receive 2 units of blood transfusion.  4.  Cough: - She is taking Tussionex which is helping her cough.  This is causing some constipation.  She will continue stool softener.      Orders placed this encounter:  Orders Placed This Encounter  Procedures  . CBC with Differential/Platelet  . Comprehensive metabolic panel  . CBC with Differential/Platelet  . Comprehensive metabolic panel      Derek Jack, MD [place] [phone]

## 2017-09-23 LAB — BPAM RBC
Blood Product Expiration Date: 201908202359
Blood Product Expiration Date: 201908202359
ISSUE DATE / TIME: 201907311415
ISSUE DATE / TIME: 201907311239
Unit Type and Rh: 6200
Unit Type and Rh: 6200

## 2017-09-23 LAB — TYPE AND SCREEN
ABO/RH(D): A POS
Antibody Screen: NEGATIVE
UNIT DIVISION: 0
Unit division: 0

## 2017-09-23 MED ORDER — FULVESTRANT 250 MG/5ML IM SOLN
INTRAMUSCULAR | Status: AC
  Filled 2017-09-23: qty 10

## 2017-09-24 ENCOUNTER — Inpatient Hospital Stay (HOSPITAL_COMMUNITY): Payer: Managed Care, Other (non HMO) | Attending: Hematology

## 2017-09-24 DIAGNOSIS — C3490 Malignant neoplasm of unspecified part of unspecified bronchus or lung: Secondary | ICD-10-CM

## 2017-09-24 DIAGNOSIS — N185 Chronic kidney disease, stage 5: Secondary | ICD-10-CM | POA: Diagnosis not present

## 2017-09-24 DIAGNOSIS — Z87891 Personal history of nicotine dependence: Secondary | ICD-10-CM | POA: Diagnosis not present

## 2017-09-24 DIAGNOSIS — Z7982 Long term (current) use of aspirin: Secondary | ICD-10-CM | POA: Diagnosis not present

## 2017-09-24 DIAGNOSIS — J449 Chronic obstructive pulmonary disease, unspecified: Secondary | ICD-10-CM | POA: Diagnosis not present

## 2017-09-24 DIAGNOSIS — C3491 Malignant neoplasm of unspecified part of right bronchus or lung: Secondary | ICD-10-CM | POA: Diagnosis present

## 2017-09-24 DIAGNOSIS — Z5111 Encounter for antineoplastic chemotherapy: Secondary | ICD-10-CM | POA: Diagnosis present

## 2017-09-24 DIAGNOSIS — M25511 Pain in right shoulder: Secondary | ICD-10-CM | POA: Insufficient documentation

## 2017-09-24 DIAGNOSIS — I120 Hypertensive chronic kidney disease with stage 5 chronic kidney disease or end stage renal disease: Secondary | ICD-10-CM | POA: Diagnosis not present

## 2017-09-24 DIAGNOSIS — K5900 Constipation, unspecified: Secondary | ICD-10-CM | POA: Insufficient documentation

## 2017-09-24 DIAGNOSIS — Z8673 Personal history of transient ischemic attack (TIA), and cerebral infarction without residual deficits: Secondary | ICD-10-CM | POA: Insufficient documentation

## 2017-09-24 DIAGNOSIS — D6489 Other specified anemias: Secondary | ICD-10-CM | POA: Insufficient documentation

## 2017-09-24 DIAGNOSIS — I776 Arteritis, unspecified: Secondary | ICD-10-CM | POA: Diagnosis not present

## 2017-09-24 DIAGNOSIS — G8929 Other chronic pain: Secondary | ICD-10-CM | POA: Insufficient documentation

## 2017-09-24 DIAGNOSIS — R042 Hemoptysis: Secondary | ICD-10-CM | POA: Insufficient documentation

## 2017-09-24 DIAGNOSIS — R079 Chest pain, unspecified: Secondary | ICD-10-CM | POA: Diagnosis not present

## 2017-09-24 LAB — COMPREHENSIVE METABOLIC PANEL
ALT: 12 U/L (ref 0–44)
AST: 21 U/L (ref 15–41)
Albumin: 3 g/dL — ABNORMAL LOW (ref 3.5–5.0)
Alkaline Phosphatase: 76 U/L (ref 38–126)
Anion gap: 10 (ref 5–15)
BUN: 55 mg/dL — ABNORMAL HIGH (ref 6–20)
CALCIUM: 9.3 mg/dL (ref 8.9–10.3)
CO2: 25 mmol/L (ref 22–32)
CREATININE: 4.36 mg/dL — AB (ref 0.44–1.00)
Chloride: 94 mmol/L — ABNORMAL LOW (ref 98–111)
GFR, EST AFRICAN AMERICAN: 12 mL/min — AB (ref >60)
GFR, EST NON AFRICAN AMERICAN: 11 mL/min — AB (ref >60)
Glucose, Bld: 93 mg/dL (ref 70–99)
Potassium: 5.1 mmol/L (ref 3.5–5.1)
Sodium: 129 mmol/L — ABNORMAL LOW (ref 135–145)
Total Bilirubin: 0.6 mg/dL (ref 0.3–1.2)
Total Protein: 6.5 g/dL (ref 6.5–8.1)

## 2017-09-24 LAB — CBC WITH DIFFERENTIAL/PLATELET
BASOS PCT: 0 %
Basophils Absolute: 0 10*3/uL (ref 0.0–0.1)
EOS PCT: 1 %
Eosinophils Absolute: 0.1 10*3/uL (ref 0.0–0.7)
HEMATOCRIT: 35.3 % — AB (ref 36.0–46.0)
HEMOGLOBIN: 11.9 g/dL — AB (ref 12.0–15.0)
LYMPHS PCT: 8 %
Lymphs Abs: 0.4 10*3/uL — ABNORMAL LOW (ref 0.7–4.0)
MCH: 30.4 pg (ref 26.0–34.0)
MCHC: 33.7 g/dL (ref 30.0–36.0)
MCV: 90.1 fL (ref 78.0–100.0)
MONOS PCT: 15 %
Monocytes Absolute: 0.8 10*3/uL (ref 0.1–1.0)
NEUTROS PCT: 76 %
Neutro Abs: 4.3 10*3/uL (ref 1.7–7.7)
Platelets: 199 10*3/uL (ref 150–400)
RBC: 3.92 MIL/uL (ref 3.87–5.11)
RDW: 16.1 % — ABNORMAL HIGH (ref 11.5–15.5)
WBC: 5.6 10*3/uL (ref 4.0–10.5)

## 2017-09-27 ENCOUNTER — Inpatient Hospital Stay (HOSPITAL_COMMUNITY): Payer: Managed Care, Other (non HMO)

## 2017-09-27 ENCOUNTER — Encounter (HOSPITAL_COMMUNITY): Payer: Self-pay | Admitting: Hematology

## 2017-09-27 ENCOUNTER — Other Ambulatory Visit: Payer: Self-pay

## 2017-09-27 ENCOUNTER — Inpatient Hospital Stay (HOSPITAL_BASED_OUTPATIENT_CLINIC_OR_DEPARTMENT_OTHER): Payer: Managed Care, Other (non HMO) | Admitting: Hematology

## 2017-09-27 VITALS — BP 159/69 | HR 86 | Temp 97.9°F | Resp 16 | Wt 84.0 lb

## 2017-09-27 VITALS — BP 158/68 | HR 78 | Temp 98.2°F | Resp 18

## 2017-09-27 DIAGNOSIS — M25511 Pain in right shoulder: Secondary | ICD-10-CM | POA: Diagnosis not present

## 2017-09-27 DIAGNOSIS — Z5111 Encounter for antineoplastic chemotherapy: Secondary | ICD-10-CM | POA: Diagnosis not present

## 2017-09-27 DIAGNOSIS — G8929 Other chronic pain: Secondary | ICD-10-CM | POA: Diagnosis not present

## 2017-09-27 DIAGNOSIS — K5900 Constipation, unspecified: Secondary | ICD-10-CM

## 2017-09-27 DIAGNOSIS — D6489 Other specified anemias: Secondary | ICD-10-CM

## 2017-09-27 DIAGNOSIS — I776 Arteritis, unspecified: Secondary | ICD-10-CM | POA: Diagnosis not present

## 2017-09-27 DIAGNOSIS — D6481 Anemia due to antineoplastic chemotherapy: Secondary | ICD-10-CM

## 2017-09-27 DIAGNOSIS — R079 Chest pain, unspecified: Secondary | ICD-10-CM

## 2017-09-27 DIAGNOSIS — N185 Chronic kidney disease, stage 5: Secondary | ICD-10-CM

## 2017-09-27 DIAGNOSIS — J449 Chronic obstructive pulmonary disease, unspecified: Secondary | ICD-10-CM

## 2017-09-27 DIAGNOSIS — R042 Hemoptysis: Secondary | ICD-10-CM

## 2017-09-27 DIAGNOSIS — C3431 Malignant neoplasm of lower lobe, right bronchus or lung: Secondary | ICD-10-CM

## 2017-09-27 DIAGNOSIS — Z8673 Personal history of transient ischemic attack (TIA), and cerebral infarction without residual deficits: Secondary | ICD-10-CM

## 2017-09-27 DIAGNOSIS — I120 Hypertensive chronic kidney disease with stage 5 chronic kidney disease or end stage renal disease: Secondary | ICD-10-CM

## 2017-09-27 DIAGNOSIS — C3491 Malignant neoplasm of unspecified part of right bronchus or lung: Secondary | ICD-10-CM | POA: Diagnosis not present

## 2017-09-27 DIAGNOSIS — Z87891 Personal history of nicotine dependence: Secondary | ICD-10-CM

## 2017-09-27 DIAGNOSIS — Z7982 Long term (current) use of aspirin: Secondary | ICD-10-CM

## 2017-09-27 DIAGNOSIS — T451X5A Adverse effect of antineoplastic and immunosuppressive drugs, initial encounter: Principal | ICD-10-CM

## 2017-09-27 MED ORDER — SODIUM CHLORIDE 0.9 % IV SOLN
Freq: Once | INTRAVENOUS | Status: AC
  Administered 2017-09-27: 10:00:00 via INTRAVENOUS

## 2017-09-27 MED ORDER — DIPHENHYDRAMINE HCL 50 MG/ML IJ SOLN
50.0000 mg | Freq: Once | INTRAMUSCULAR | Status: AC
  Administered 2017-09-27: 50 mg via INTRAVENOUS
  Filled 2017-09-27: qty 1

## 2017-09-27 MED ORDER — SODIUM CHLORIDE 0.9 % IV SOLN
45.0000 mg/m2 | Freq: Once | INTRAVENOUS | Status: AC
  Administered 2017-09-27: 60 mg via INTRAVENOUS
  Filled 2017-09-27: qty 10

## 2017-09-27 MED ORDER — SODIUM CHLORIDE 0.9 % IV SOLN
20.0000 mg | Freq: Once | INTRAVENOUS | Status: AC
  Administered 2017-09-27: 20 mg via INTRAVENOUS
  Filled 2017-09-27: qty 2

## 2017-09-27 MED ORDER — PALONOSETRON HCL INJECTION 0.25 MG/5ML
0.2500 mg | Freq: Once | INTRAVENOUS | Status: AC
  Administered 2017-09-27: 0.25 mg via INTRAVENOUS
  Filled 2017-09-27: qty 5

## 2017-09-27 MED ORDER — FAMOTIDINE IN NACL 20-0.9 MG/50ML-% IV SOLN
20.0000 mg | Freq: Once | INTRAVENOUS | Status: AC
  Administered 2017-09-27: 20 mg via INTRAVENOUS
  Filled 2017-09-27: qty 50

## 2017-09-27 MED ORDER — HEPARIN SOD (PORK) LOCK FLUSH 100 UNIT/ML IV SOLN
500.0000 [IU] | Freq: Once | INTRAVENOUS | Status: AC | PRN
  Administered 2017-09-27: 500 [IU]
  Filled 2017-09-27: qty 5

## 2017-09-27 MED ORDER — SODIUM CHLORIDE 0.9 % IV SOLN
68.6000 mg | Freq: Once | INTRAVENOUS | Status: AC
  Administered 2017-09-27: 70 mg via INTRAVENOUS
  Filled 2017-09-27: qty 7

## 2017-09-27 MED ORDER — SODIUM CHLORIDE 0.9% FLUSH
10.0000 mL | INTRAVENOUS | Status: DC | PRN
  Administered 2017-09-27: 10 mL
  Filled 2017-09-27: qty 10

## 2017-09-27 NOTE — Progress Notes (Signed)
Patient seen today for office visit with Dr. Delton Coombes, will proceed with treatment today.   Treatment given per orders. Patient tolerated it well without problems. Vitals stable and discharged home from clinic ambulatory. Follow up as scheduled.

## 2017-09-27 NOTE — Patient Instructions (Signed)
[facility] Discharge Instructions for Patients Receiving Chemotherapy   Beginning January 23rd 2017 lab work for the Cancer Center will be done in the  Main lab at  on 1st floor. If you have a lab appointment with the Cancer Center please come in thru the  Main Entrance and check in at the main information desk   Today you received the following chemotherapy agents   To help prevent nausea and vomiting after your treatment, we encourage you to take your nausea medication     If you develop nausea and vomiting, or diarrhea that is not controlled by your medication, call the clinic.  The clinic phone number is [phone]. Office hours are Monday-Friday 8:30am-5:00pm.  BELOW ARE SYMPTOMS THAT SHOULD BE REPORTED IMMEDIATELY:  *FEVER GREATER THAN 101.0 F  *CHILLS WITH OR WITHOUT FEVER  NAUSEA AND VOMITING THAT IS NOT CONTROLLED WITH YOUR NAUSEA MEDICATION  *UNUSUAL SHORTNESS OF BREATH  *UNUSUAL BRUISING OR BLEEDING  TENDERNESS IN MOUTH AND THROAT WITH OR WITHOUT PRESENCE OF ULCERS  *URINARY PROBLEMS  *BOWEL PROBLEMS  UNUSUAL RASH Items with * indicate a potential emergency and should be followed up as soon as possible. If you have an emergency after office hours please contact your primary care physician or go to the nearest emergency department.  Please call the clinic during office hours if you have any questions or concerns.   You may also contact the Patient Navigator at [phone] should you have any questions or need assistance in obtaining follow up care.      Resources For Cancer Patients and their Caregivers ? American Cancer Society: Can assist with transportation, wigs, general needs, runs Look Good Feel Better.        1-888-227-6333 ? Cancer Care: Provides financial assistance, online support groups, medication/co-pay assistance.  1-800-813-HOPE (4673) ? [facility] Assists [place] cancer  patients and their families through emotional , educational and financial support.  336-427-4357 ? [place] DSS Where to apply for food stamps, Medicaid and utility assistance. 336-342-1394 ? [facility]: Transportation to medical appointments. 336-347-2287 ? Social Security Administration: May apply for disability if have a Stage IV cancer. 336-342-7796 1-800-772-1213 ? [place] [facility]ging, Disability and Transit Services: Assists with nutrition, care and transit needs. 336-349-2343         

## 2017-09-27 NOTE — Progress Notes (Signed)
Tasha [name], [place] [address]   CLINIC:  Medical Oncology/Hematology  PCP:  Glenda Chroman, MD [place] [phone]   REASON FOR VISIT:  Follow-up for stage III lung cancer  CURRENT THERAPY: chemoradiation with carboplatin and paclitaxel  BRIEF ONCOLOGIC HISTORY:    Malignant neoplasm of lung ([facility])   06/30/2017 Initial Diagnosis    Malignant neoplasm of bronchus and lung ([facility])      08/15/2017 -  Chemotherapy    The patient had palonosetron (ALOXI) injection 0.25 mg, 0.25 mg, Intravenous,  Once, 3 of 4 cycles Administration: 0.25 mg (08/18/2017), 0.25 mg (08/25/2017), 0.25 mg (09/08/2017) CARBOplatin (PARAPLATIN) 70 mg in sodium chloride 0.9 % 100 mL chemo infusion, 70 mg (100 % of original dose 67.6 mg), Intravenous,  Once, 3 of 4 cycles Dose modification:   (original dose 67.6 mg, Cycle 1),   (original dose 68.6 mg, Cycle 2),   (original dose 70.8 mg, Cycle 3),   (original dose 68.6 mg, Cycle 4) Administration: 70 mg (08/18/2017), 70 mg (08/25/2017), 70 mg (09/08/2017) PACLitaxel (TAXOL) 60 mg in sodium chloride 0.9 % 150 mL chemo infusion (</= 52m/m2), 45 mg/m2 = 60 mg, Intravenous,  Once, 3 of 4 cycles Administration: 60 mg (08/18/2017), 60 mg (08/25/2017), 60 mg (09/08/2017)  for chemotherapy treatment.         CANCER STAGING: Cancer Staging No matching staging information was found for the patient.   INTERVAL HISTORY:  Ms. [id number].[id number]. female returns for routine follow-up for stage II lung cancer and consideration for next cycle of chemotherapy. Patient is here today with her husband. She is still coughing mostly at night but denies hemoptysis. She feels better after last week getting blood and fluids. Her last radiation treatment will be this Friday. She will receive one more cycle of chemo the following Monday. Patient states it is getting harder to swallow. High citrus foods burn her throat and hard crunchy foods hurt  her throat. She is maintaining her weight with 3 boosts daily. Her energy and appetite are at 50%. She remains active at home and performs all her own ADLs.     REVIEW OF SYSTEMS:  Review of Systems  HENT:   Positive for trouble swallowing.   All other systems reviewed and are negative.    PAST MEDICAL/SURGICAL HISTORY:  Past Medical History:  Diagnosis Date  . Anemia   . Aortic insufficiency   . Asthma   . Breast nodule   . Carotid artery disease ([facility]   . 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 CKD (chronic kidney disease) stage 4, GFR 15-29 ml/min (HCC)   . COPD (chronic obstructive pulmonary disease) ([place]   . CRI (chronic renal insufficiency)   . Folic acid deficiency   . Glaucoma   . Graves disease   . Hyperlipidemia   . Hypertension   . Iron deficiency   . Lung cancer ([facility]    non-small cell right lung  . Recurrent UTI   . Renal cyst   . Systemic vasculitis ([facility]    Not otherwise specified, Dr. KSonny Dandy  Past Surgical History:  Procedure Laterality Date  . BRONCHOSCOPY    . COLONOSCOPY    . ORIF ORBITAL FRACTURE  1996   Left  . PORTACATH PLACEMENT Left 08/25/2017   Procedure: INSERTION PORT-A-CATH;  Surgeon: JAviva Signs MD;  Location: [facility];  Service: General;  Laterality: Left;  .  SPINAL FUSION  1994   At C3-5     SOCIAL HISTORY:  Social History   Socioeconomic History  . Marital status: Married    Spouse name: Not on file  . Number of children: Not on file  . Years of education: Not on file  . Highest education level: Not on file  Occupational History  . Not on file  Social Needs  . Financial resource strain: Not on file  . Food insecurity:    Worry: Not on file    Inability: Not on file  . Transportation needs:    Medical: Not on file    Non-medical: Not on file  Tobacco Use  . Smoking status: Former Smoker    Years: 25.00    Types: Cigarettes    Last attempt to quit: 02/24/2008    Years since  quitting: 9.5  . Smokeless tobacco: Never Used  Substance and Sexual Activity  . Alcohol use: Not Currently  . Drug use: No  . Sexual activity: Not on file  Lifestyle  . Physical activity:    Days per week: Not on file    Minutes per session: Not on file  . Stress: Not on file  Relationships  . Social connections:    Talks on phone: Not on file    Gets together: Not on file    Attends religious service: Not on file    Active member of club or organization: Not on file    Attends meetings of clubs or organizations: Not on file    Relationship status: Not on file  . Intimate partner violence:    Fear of current or ex partner: Not on file    Emotionally abused: Not on file    Physically abused: Not on file    Forced sexual activity: Not on file  Other Topics Concern  . Not on file  Social History Narrative   No regular exercise.    FAMILY HISTORY:  Family History  Problem Relation Age of Onset  . Heart attack Father   . Diverticulitis Mother   . Fibromyalgia Mother   . Hypertension Brother     CURRENT MEDICATIONS:  Outpatient Encounter Medications as of 09/27/2017  Medication Sig  . albuterol (PROAIR HFA) 108 (90 BASE) MCG/ACT inhaler Inhale 2 puffs into the lungs every 4 (four) hours as needed for wheezing or shortness of breath.   Marland Kitchen amLODipine (NORVASC) 10 MG tablet Take 10 mg by mouth daily.  Marland Kitchen aspirin EC 325 MG tablet Take 162.5-325 mg by mouth See admin instructions. Take 325 mg by mouth every other day alternating with 162.5 mg by mouth every other day  . calcitRIOL (ROCALTROL) 0.25 MCG capsule Take 0.25 mcg by mouth daily.   . chlorpheniramine-HYDROcodone (TUSSIONEX) 10-8 MG/5ML SUER Take 5 mLs by mouth every 12 (twelve) hours as needed for cough.  . ferrous sulfate 325 (65 FE) MG tablet Take 325 mg by mouth daily.    . folic acid (FOLVITE) 1 MG tablet Take 1 mg by mouth daily.    Marland Kitchen HYDROcodone-acetaminophen (NORCO) 5-325 MG tablet Take 1 tablet by mouth every 6  (six) hours as needed for moderate pain.  Marland Kitchen lidocaine-prilocaine (EMLA) cream Apply a quarter size amount to port site 1 hour prior to chemo. Do not rub in. Cover with plastic wrap.  . metoprolol tartrate (LOPRESSOR) 25 MG tablet Take 25 mg by mouth 2 (two) times daily.   . prochlorperazine (COMPAZINE) 10 MG tablet Take 1 tablet (10 mg  total) by mouth every 6 (six) hours as needed for nausea or vomiting.  . sodium bicarbonate 650 MG tablet Take 650 mg by mouth 2 (two) times daily.   Marland Kitchen warfarin (COUMADIN) 5 MG tablet    No facility-administered encounter medications on file as of 09/27/2017.     ALLERGIES:  Allergies  Allergen Reactions  . Sulfonamide Derivatives Rash     PHYSICAL EXAM:  ECOG Performance status: 1  Vitals:   09/27/17 0904  BP: (!) 159/69  Pulse: 86  Resp: 16  Temp: 97.9 F (36.6 C)  SpO2: 100%   Filed Weights   09/27/17 0904  Weight: 84 lb (38.1 kg)    Physical Exam  Constitutional: She is oriented to person, place, and time.  Cardiovascular: Normal rate, regular rhythm and normal heart sounds.  Pulmonary/Chest: Effort normal and breath sounds normal.  Neurological: She is alert and oriented to person, place, and time.  Skin: Skin is warm and dry.     LABORATORY DATA:  I have reviewed the labs as listed.  CBC    Component Value Date/Time   WBC 5.6 09/24/2017 1005   RBC 3.92 09/24/2017 1005   HGB 11.9 (L) 09/24/2017 1005   HCT 35.3 (L) 09/24/2017 1005   PLT 199 09/24/2017 1005   MCV 90.1 09/24/2017 1005   MCH 30.4 09/24/2017 1005   MCHC 33.7 09/24/2017 1005   RDW 16.1 (H) 09/24/2017 1005   LYMPHSABS 0.4 (L) 09/24/2017 1005   MONOABS 0.8 09/24/2017 1005   EOSABS 0.1 09/24/2017 1005   BASOSABS 0.0 09/24/2017 1005   CMP Latest Ref Rng & Units 09/24/2017 09/22/2017 09/15/2017  Glucose 70 - 99 mg/dL 93 98 99  BUN 6 - 20 mg/dL 55(H) 63(H) 76(H)  Creatinine 0.44 - 1.00 mg/dL 4.36(H) 3.99(H) 4.42(H)  Sodium 135 - 145 mmol/L 129(L) 129(L) 126(L)    Potassium 3.5 - 5.1 mmol/L 5.1 4.7 5.5(H)  Chloride 98 - 111 mmol/L 94(L) 92(L) 92(L)  CO2 22 - 32 mmol/L 25 26 21(L)  Calcium 8.9 - 10.3 mg/dL 9.3 9.3 9.3  Total Protein 6.5 - 8.1 g/dL 6.5 6.2(L) 6.3(L)  Total Bilirubin 0.3 - 1.2 mg/dL 0.6 0.3 0.5  Alkaline Phos 38 - 126 U/L 76 55 52  AST 15 - 41 U/L 21 20 21   ALT 0 - 44 U/L 12 12 13          ASSESSMENT & PLAN:   Malignant neoplasm of lung (HCC) 1.  Clinical stage III (T4N1) right lung poorly differentiated adenocarcinoma, EGFR negative: -Patient underwent a CT scan of the chest for work-up of kidney transplant at [facility] and was found to have spiculated solid nodule in the posterior right lower lobe measuring 1.6 x 1.2 cm with fullness in the right hilum.  On 06/15/2011 she underwent EBUS guided biopsy.  7S lymph node was negative for malignancy.  Right hilar mass was positive for carcinoma.  Endobronchial biopsy showed poorly differentiated adenocarcinoma.  She also had endobronchial tumor debulked (75% obstructing reduced to 25% obstructing). BI had tumor 1.5 cm distal to right upper lobe takeoff.  Full EBUS staging shows no adenopathy at 11 L, 10 L, 4L or 4R.  Left mainstem and segments clear.  Right upper lobe normal.  - Her hemoptysis has improved since she stopped taking Coumadin.  She takes aspirin daily.  She is on Coumadin for vasculitis and strokes. - PET CT scan dated 07/05/2017 shows 1.5 cm medial right lower lobe nodule, and right perihilar mass with  no evidence of distant metastasis.  We also talked about MRI of the brain without contrast dated 07/07/2017 which did not show any intracranial metastasis. -She saw Dr. Roxan Hockey for opinion regarding surgery.  Dr. Roxan Hockey did not think she would be a surgical candidate.  He also thought the tumor is encroaching the mediastinum. - Hemoptysis has improved over the last few days.  Complains of occasional vague chest pain which is nagging in the center of the chest.  She also  complained of pain in her right shoulder and has difficulty putting it up when she is having radiation therapy.  She reportedly had neck surgery with resulting chronic right shoulder pain in certain positions.  I have given a prescription for tramadol 50 mg to be taken an hour before the radiation.  She tried taking ibuprofen, but we are concerned about her kidney function. -She started radiation therapy on 08/18/2017.  She tolerated her first weekly dose of carboplatin and paclitaxel very well.  Her week 2 of treatment was on 08/25/2017.  Her week 3 treatment on 09/01/2017 was held due to low white count.  She also had diarrhea which resolved.  C. difficile was negative. - She received week 3 on 09/08/2017. -Her blood counts are adequate to proceed with cycle 4 of weekly carboplatin and paclitaxel.  She is finishing up her radiation this Friday.  I plan to give at least 1 more cycle of carboplatin and paclitaxel next week.  I plan to rescan her in 4 weeks from next week with plans to start her on immunotherapy with Durvalumab.  2.  CKD: She has stage V CKD.  Hence she was being worked up for kidney transplant which is put on hold at this time.  She follows up with Dr. Elita Quick in [place].  3.  Anemia: -This is from a combination of chronic kidney disease and chemotherapy.  She received Feraheme on 09/01/2017.  She also received 2 units of blood transfusion last week.  Hemoglobin improved.  4.  Cough: - She is taking Tussionex which is helping her cough.  This is causing some constipation.  She will continue stool softener.      Orders placed this encounter:  Orders Placed This Encounter  Procedures  . CBC with Differential/Platelet  . Comprehensive metabolic panel      Derek Jack, MD [place] [phone]

## 2017-09-27 NOTE — Patient Instructions (Signed)
[place] at [facility] Discharge Instructions  We will see you on Monday for your last cycle of chemo. Please have labs drawn before coming to clinic. Thank you.    Thank you for choosing [place] at [facility] to provide your oncology and hematology care.  To afford each patient quality time with our provider, please arrive at least 15 minutes before your scheduled appointment time.   If you have a lab appointment with the [facility] please come in thru the  Main Entrance and check in at the main information desk  You need to re-schedule your appointment should you arrive 10 or more minutes late.  We strive to give you quality time with our providers, and arriving late affects you and other patients whose appointments are after yours.  Also, if you no show three or more times for appointments you may be dismissed from the clinic at the providers discretion.     Again, thank you for choosing [facility].  Our hope is that these requests will decrease the amount of time that you wait before being seen by our physicians.       _____________________________________________________________  Should you have questions after your visit to [facility], please contact our office at [phone] between the hours of 8:00 a.m. and 4:30 p.m.  Voicemails left after 4:00 p.m. will not be returned until the following business day.  For prescription refill requests, have your pharmacy contact our office and allow 72 hours.    Cancer Center Support Programs:   > Cancer Support Group  2nd Tuesday of the month 1pm-2pm, Journey Room

## 2017-09-27 NOTE — Assessment & Plan Note (Signed)
1.  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  On 06/15/2011 she underwent EBUS guided biopsy.  7S lymph node was negative for malignancy.  Right hilar mass was positive for carcinoma.  Endobronchial biopsy showed poorly differentiated adenocarcinoma.  She also had endobronchial tumor debulked (75% obstructing reduced to 25% obstructing). BI had tumor 1.5 cm distal to right upper lobe takeoff.  Full EBUS staging shows no adenopathy at 11 L, 10 L, 4L or 4R.  Left mainstem and segments clear.  Right upper lobe normal.  - Her hemoptysis has improved since she stopped taking Coumadin.  She takes aspirin daily.  She is on Coumadin for vasculitis and strokes. - PET CT scan dated 07/05/2017 shows 1.5 cm medial right lower lobe nodule, and right perihilar mass with no evidence of distant metastasis.  We also talked about MRI of the brain without contrast dated 07/07/2017 which did not show any intracranial metastasis. -She saw Dr. Roxan Hockey for opinion regarding surgery.  Dr. Roxan Hockey did not think she would be a surgical candidate.  He also thought the tumor is encroaching the mediastinum. - Hemoptysis has improved over the last few days.  Complains of occasional vague chest pain which is nagging in the center of the chest.  She also complained of pain in her right shoulder and has difficulty putting it up when she is having radiation therapy.  She reportedly had neck surgery with resulting chronic right shoulder pain in certain positions.  I have given a prescription for tramadol 50 mg to be taken an hour before the radiation.  She tried taking ibuprofen, but we are concerned about her kidney function. -She started radiation therapy on  08/18/2017.  She tolerated her first weekly dose of carboplatin and paclitaxel very well.  Her week 2 of treatment was on 08/25/2017.  Her week 3 treatment on 09/01/2017 was held due to low white count.  She also had diarrhea which resolved.  C. difficile was negative. - She received week 3 on 09/08/2017. -Her blood counts are adequate to proceed with cycle 4 of weekly carboplatin and paclitaxel.  She is finishing up her radiation this Friday.  I plan to give at least 1 more cycle of carboplatin and paclitaxel next week.  I plan to rescan her in 4 weeks from next week with plans to start her on immunotherapy with Durvalumab.  2.  CKD: She has stage V CKD.  Hence she was being worked up for kidney transplant which is put on hold at this time.  She follows up with Dr. Elita Quick in [place].  3.  Anemia: -This is from a combination of chronic kidney disease and chemotherapy.  She received Feraheme on 09/01/2017.  She also received 2 units of blood transfusion last week.  Hemoglobin improved.  4.  Cough: - She is taking Tussionex which is helping her cough.  This is causing some constipation.  She will continue stool softener.

## 2017-09-28 MED ORDER — DEXAMETHASONE SODIUM PHOSPHATE 10 MG/ML IJ SOLN
INTRAMUSCULAR | Status: AC
  Filled 2017-09-28: qty 1

## 2017-09-30 MED ORDER — DIPHENHYDRAMINE HCL 25 MG PO CAPS
ORAL_CAPSULE | ORAL | Status: AC
  Filled 2017-09-30: qty 1

## 2017-10-01 ENCOUNTER — Other Ambulatory Visit (HOSPITAL_COMMUNITY): Payer: Self-pay

## 2017-10-01 DIAGNOSIS — D6481 Anemia due to antineoplastic chemotherapy: Secondary | ICD-10-CM

## 2017-10-01 DIAGNOSIS — C3490 Malignant neoplasm of unspecified part of unspecified bronchus or lung: Secondary | ICD-10-CM

## 2017-10-01 DIAGNOSIS — T451X5A Adverse effect of antineoplastic and immunosuppressive drugs, initial encounter: Principal | ICD-10-CM

## 2017-10-01 DIAGNOSIS — C3431 Malignant neoplasm of lower lobe, right bronchus or lung: Secondary | ICD-10-CM

## 2017-10-04 ENCOUNTER — Inpatient Hospital Stay (HOSPITAL_COMMUNITY): Payer: Managed Care, Other (non HMO)

## 2017-10-04 ENCOUNTER — Encounter (HOSPITAL_COMMUNITY): Payer: Self-pay | Admitting: Hematology

## 2017-10-04 ENCOUNTER — Inpatient Hospital Stay (HOSPITAL_BASED_OUTPATIENT_CLINIC_OR_DEPARTMENT_OTHER): Payer: Managed Care, Other (non HMO) | Admitting: Hematology

## 2017-10-04 VITALS — BP 156/61 | HR 86 | Temp 98.7°F | Resp 16

## 2017-10-04 VITALS — BP 125/82 | HR 80 | Temp 97.8°F | Resp 14 | Wt 84.6 lb

## 2017-10-04 DIAGNOSIS — J449 Chronic obstructive pulmonary disease, unspecified: Secondary | ICD-10-CM

## 2017-10-04 DIAGNOSIS — C3490 Malignant neoplasm of unspecified part of unspecified bronchus or lung: Secondary | ICD-10-CM

## 2017-10-04 DIAGNOSIS — Z5111 Encounter for antineoplastic chemotherapy: Secondary | ICD-10-CM | POA: Diagnosis not present

## 2017-10-04 DIAGNOSIS — C3491 Malignant neoplasm of unspecified part of right bronchus or lung: Secondary | ICD-10-CM

## 2017-10-04 DIAGNOSIS — I776 Arteritis, unspecified: Secondary | ICD-10-CM

## 2017-10-04 DIAGNOSIS — R042 Hemoptysis: Secondary | ICD-10-CM

## 2017-10-04 DIAGNOSIS — D6489 Other specified anemias: Secondary | ICD-10-CM

## 2017-10-04 DIAGNOSIS — M25511 Pain in right shoulder: Secondary | ICD-10-CM

## 2017-10-04 DIAGNOSIS — N185 Chronic kidney disease, stage 5: Secondary | ICD-10-CM

## 2017-10-04 DIAGNOSIS — C3431 Malignant neoplasm of lower lobe, right bronchus or lung: Secondary | ICD-10-CM

## 2017-10-04 DIAGNOSIS — Z87891 Personal history of nicotine dependence: Secondary | ICD-10-CM

## 2017-10-04 DIAGNOSIS — Z8673 Personal history of transient ischemic attack (TIA), and cerebral infarction without residual deficits: Secondary | ICD-10-CM

## 2017-10-04 DIAGNOSIS — D6481 Anemia due to antineoplastic chemotherapy: Secondary | ICD-10-CM

## 2017-10-04 DIAGNOSIS — Z7982 Long term (current) use of aspirin: Secondary | ICD-10-CM

## 2017-10-04 DIAGNOSIS — G8929 Other chronic pain: Secondary | ICD-10-CM | POA: Diagnosis not present

## 2017-10-04 DIAGNOSIS — T451X5A Adverse effect of antineoplastic and immunosuppressive drugs, initial encounter: Principal | ICD-10-CM

## 2017-10-04 DIAGNOSIS — K5900 Constipation, unspecified: Secondary | ICD-10-CM

## 2017-10-04 DIAGNOSIS — I120 Hypertensive chronic kidney disease with stage 5 chronic kidney disease or end stage renal disease: Secondary | ICD-10-CM

## 2017-10-04 DIAGNOSIS — R079 Chest pain, unspecified: Secondary | ICD-10-CM

## 2017-10-04 LAB — CBC WITH DIFFERENTIAL/PLATELET
Basophils Absolute: 0 10*3/uL (ref 0.0–0.1)
Basophils Relative: 0 %
Eosinophils Absolute: 0 10*3/uL (ref 0.0–0.7)
Eosinophils Relative: 0 %
HCT: 29.8 % — ABNORMAL LOW (ref 36.0–46.0)
Hemoglobin: 10 g/dL — ABNORMAL LOW (ref 12.0–15.0)
LYMPHS ABS: 0.5 10*3/uL — AB (ref 0.7–4.0)
Lymphocytes Relative: 10 %
MCH: 30.6 pg (ref 26.0–34.0)
MCHC: 33.6 g/dL (ref 30.0–36.0)
MCV: 91.1 fL (ref 78.0–100.0)
Monocytes Absolute: 0.2 10*3/uL (ref 0.1–1.0)
Monocytes Relative: 4 %
Neutro Abs: 4.2 10*3/uL (ref 1.7–7.7)
Neutrophils Relative %: 86 %
Platelets: 228 10*3/uL (ref 150–400)
RBC: 3.27 MIL/uL — AB (ref 3.87–5.11)
RDW: 15.3 % (ref 11.5–15.5)
WBC: 4.9 10*3/uL (ref 4.0–10.5)

## 2017-10-04 LAB — COMPREHENSIVE METABOLIC PANEL
ALT: 11 U/L (ref 0–44)
AST: 20 U/L (ref 15–41)
Albumin: 3 g/dL — ABNORMAL LOW (ref 3.5–5.0)
Alkaline Phosphatase: 66 U/L (ref 38–126)
Anion gap: 11 (ref 5–15)
BUN: 66 mg/dL — ABNORMAL HIGH (ref 6–20)
CHLORIDE: 94 mmol/L — AB (ref 98–111)
CO2: 25 mmol/L (ref 22–32)
Calcium: 9.2 mg/dL (ref 8.9–10.3)
Creatinine, Ser: 4.59 mg/dL — ABNORMAL HIGH (ref 0.44–1.00)
GFR, EST AFRICAN AMERICAN: 12 mL/min — AB (ref >60)
GFR, EST NON AFRICAN AMERICAN: 10 mL/min — AB (ref >60)
Glucose, Bld: 102 mg/dL — ABNORMAL HIGH (ref 70–99)
Potassium: 5.1 mmol/L (ref 3.5–5.1)
Sodium: 130 mmol/L — ABNORMAL LOW (ref 135–145)
Total Bilirubin: 0.6 mg/dL (ref 0.3–1.2)
Total Protein: 6.1 g/dL — ABNORMAL LOW (ref 6.5–8.1)

## 2017-10-04 LAB — SAMPLE TO BLOOD BANK

## 2017-10-04 MED ORDER — PALONOSETRON HCL INJECTION 0.25 MG/5ML
INTRAVENOUS | Status: AC
  Filled 2017-10-04: qty 5

## 2017-10-04 MED ORDER — SODIUM CHLORIDE 0.9 % IV SOLN
67.8000 mg | Freq: Once | INTRAVENOUS | Status: AC
  Administered 2017-10-04: 70 mg via INTRAVENOUS
  Filled 2017-10-04: qty 7

## 2017-10-04 MED ORDER — PALONOSETRON HCL INJECTION 0.25 MG/5ML
0.2500 mg | Freq: Once | INTRAVENOUS | Status: AC
  Administered 2017-10-04: 0.25 mg via INTRAVENOUS

## 2017-10-04 MED ORDER — SODIUM CHLORIDE 0.9% FLUSH
10.0000 mL | INTRAVENOUS | Status: DC | PRN
  Administered 2017-10-04: 10 mL
  Filled 2017-10-04: qty 10

## 2017-10-04 MED ORDER — SODIUM CHLORIDE 0.9 % IV SOLN
Freq: Once | INTRAVENOUS | Status: AC
  Administered 2017-10-04: 11:00:00 via INTRAVENOUS

## 2017-10-04 MED ORDER — SODIUM CHLORIDE 0.9 % IV SOLN
20.0000 mg | Freq: Once | INTRAVENOUS | Status: AC
  Administered 2017-10-04: 20 mg via INTRAVENOUS
  Filled 2017-10-04: qty 2

## 2017-10-04 MED ORDER — TRAMADOL HCL 50 MG PO TABS
50.0000 mg | ORAL_TABLET | Freq: Once | ORAL | Status: AC
  Administered 2017-10-04: 50 mg via ORAL
  Filled 2017-10-04: qty 1

## 2017-10-04 MED ORDER — FAMOTIDINE IN NACL 20-0.9 MG/50ML-% IV SOLN
20.0000 mg | Freq: Once | INTRAVENOUS | Status: AC
  Administered 2017-10-04: 20 mg via INTRAVENOUS
  Filled 2017-10-04: qty 50

## 2017-10-04 MED ORDER — HEPARIN SOD (PORK) LOCK FLUSH 100 UNIT/ML IV SOLN
500.0000 [IU] | Freq: Once | INTRAVENOUS | Status: AC | PRN
  Administered 2017-10-04: 500 [IU]
  Filled 2017-10-04: qty 5

## 2017-10-04 MED ORDER — SODIUM CHLORIDE 0.9 % IV SOLN
45.0000 mg/m2 | Freq: Once | INTRAVENOUS | Status: AC
  Administered 2017-10-04: 60 mg via INTRAVENOUS
  Filled 2017-10-04: qty 10

## 2017-10-04 MED ORDER — HYDROCOD POLST-CPM POLST ER 10-8 MG/5ML PO SUER: 5.0000 mL | Freq: Two times a day (BID) | ORAL | 0 refills | Status: DC | PRN

## 2017-10-04 MED ORDER — DIPHENHYDRAMINE HCL 50 MG/ML IJ SOLN
50.0000 mg | Freq: Once | INTRAMUSCULAR | Status: AC
  Administered 2017-10-04: 50 mg via INTRAVENOUS
  Filled 2017-10-04: qty 1

## 2017-10-04 NOTE — Progress Notes (Signed)
[place], [place] [address]   CLINIC:  Medical Oncology/Hematology  PCP:  Tasha Chroman, MD [address] [place] [address] [phone]   REASON FOR VISIT:  Follow-up for stage III lung cancer  CURRENT THERAPY: carboplatin and paclitaxel   BRIEF ONCOLOGIC HISTORY:    Malignant neoplasm of lung ([facility])   06/30/2017 Initial Diagnosis    Malignant neoplasm of bronchus and lung ([place])    08/15/2017 -  Chemotherapy    The patient had palonosetron (ALOXI) injection 0.25 mg, 0.25 mg, Intravenous,  Once, 5 of 5 cycles Administration: 0.25 mg (08/18/2017), 0.25 mg (08/25/2017), 0.25 mg (09/08/2017), 0.25 mg (09/27/2017), 0.25 mg (10/04/2017) CARBOplatin (PARAPLATIN) 70 mg in sodium chloride 0.9 % 100 mL chemo infusion, 70 mg (100 % of original dose 67.6 mg), Intravenous,  Once, 5 of 5 cycles Dose modification:   (original dose 67.6 mg, Cycle 1),   (original dose 68.6 mg, Cycle 2),   (original dose 70.8 mg, Cycle 3),   (original dose 68.6 mg, Cycle 4) Administration: 70 mg (08/18/2017), 70 mg (08/25/2017), 70 mg (09/08/2017), 70 mg (09/27/2017), 70 mg (10/04/2017) PACLitaxel (TAXOL) 60 mg in sodium chloride 0.9 % 150 mL chemo infusion (</= 40m/m2), 45 mg/m2 = 60 mg, Intravenous,  Once, 5 of 5 cycles Administration: 60 mg (08/18/2017), 60 mg (08/25/2017), 60 mg (09/08/2017), 60 mg (09/27/2017), 60 mg (10/04/2017)  for chemotherapy treatment.       INTERVAL HISTORY:  [name].[id number]. female returns for routine follow-up for stage III lung cancer and consideration for her next cycle of chemo therapy. Patient is here today with her husband. Overall she is feeling good. She has slight rib soreness and hand pain from knitting all weekend. She denies any nausea, vomiting, or diarrhea. She denies any throat pain with swallowing. Her cough is better. No hemoptysis.  Denies any bleeding. Patients appetite and energy level are at 50%. Patient is maintaining her weight. She finished her  radiation treatment last Friday.     REVIEW OF SYSTEMS:  Review of Systems  All other systems reviewed and are negative.    PAST MEDICAL/SURGICAL HISTORY:  Past Medical History:  Diagnosis Date  . Anemia   . Aortic insufficiency   . Asthma   . Breast nodule   . Carotid artery disease ([facility]   . 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 CKD (chronic kidney disease) stage 4, GFR 15-29 ml/min (HCC)   . COPD (chronic obstructive pulmonary disease) ([facility]   . CRI (chronic renal insufficiency)   . Folic acid deficiency   . Glaucoma   . Graves disease   . Hyperlipidemia   . Hypertension   . Iron deficiency   . Lung cancer ([place]    non-small cell right lung  . Recurrent UTI   . Renal cyst   . Systemic vasculitis ([facility]    Not otherwise specified, Dr. KSonny Dandy  Past Surgical History:  Procedure Laterality Date  . BRONCHOSCOPY    . COLONOSCOPY    . ORIF ORBITAL FRACTURE  1996   Left  . PORTACATH PLACEMENT Left 08/25/2017   Procedure: INSERTION PORT-A-CATH;  Surgeon: JAviva Signs MD;  Location: [facility];  Service: General;  Laterality: Left;  . SPINAL FUSION  1994   At C3-5     SOCIAL HISTORY:  Social History   Socioeconomic History  . Marital status: Married    Spouse name: Not on  file  . Number of children: Not on file  . Years of education: Not on file  . Highest education level: Not on file  Occupational History  . Not on file  Social Needs  . Financial resource strain: Not on file  . Food insecurity:    Worry: Not on file    Inability: Not on file  . Transportation needs:    Medical: Not on file    Non-medical: Not on file  Tobacco Use  . Smoking status: Former Smoker    Years: 25.00    Types: Cigarettes    Last attempt to quit: 02/24/2008    Years since quitting: 9.6  . Smokeless tobacco: Never Used  Substance and Sexual Activity  . Alcohol use: Not Currently  . Drug use: No  . Sexual activity: Not on file  Lifestyle  .  Physical activity:    Days per week: Not on file    Minutes per session: Not on file  . Stress: Not on file  Relationships  . Social connections:    Talks on phone: Not on file    Gets together: Not on file    Attends religious service: Not on file    Active member of club or organization: Not on file    Attends meetings of clubs or organizations: Not on file    Relationship status: Not on file  . Intimate partner violence:    Fear of current or ex partner: Not on file    Emotionally abused: Not on file    Physically abused: Not on file    Forced sexual activity: Not on file  Other Topics Concern  . Not on file  Social History Narrative   No regular exercise.    FAMILY HISTORY:  Family History  Problem Relation Age of Onset  . Heart attack Father   . Diverticulitis Mother   . Fibromyalgia Mother   . Hypertension Brother     CURRENT MEDICATIONS:  Outpatient Encounter Medications as of 10/04/2017  Medication Sig  . albuterol (PROAIR HFA) 108 (90 BASE) MCG/ACT inhaler Inhale 2 puffs into the lungs every 4 (four) hours as needed for wheezing or shortness of breath.   Marland Kitchen amLODipine (NORVASC) 10 MG tablet Take 10 mg by mouth daily.  Marland Kitchen aspirin EC 325 MG tablet Take 162.5-325 mg by mouth See admin instructions. Take 325 mg by mouth every other day alternating with 162.5 mg by mouth every other day  . calcitRIOL (ROCALTROL) 0.25 MCG capsule Take 0.25 mcg by mouth daily.   . chlorpheniramine-HYDROcodone (TUSSIONEX) 10-8 MG/5ML SUER Take 5 mLs by mouth every 12 (twelve) hours as needed for cough.  . ferrous sulfate 325 (65 FE) MG tablet Take 325 mg by mouth daily.    . folic acid (FOLVITE) 1 MG tablet Take 1 mg by mouth daily.    Marland Kitchen HYDROcodone-acetaminophen (NORCO) 5-325 MG tablet Take 1 tablet by mouth every 6 (six) hours as needed for moderate pain.  Marland Kitchen lidocaine-prilocaine (EMLA) cream Apply a quarter size amount to port site 1 hour prior to chemo. Do not rub in. Cover with plastic  wrap.  . metoprolol tartrate (LOPRESSOR) 25 MG tablet Take 25 mg by mouth 2 (two) times daily.   . prochlorperazine (COMPAZINE) 10 MG tablet Take 1 tablet (10 mg total) by mouth every 6 (six) hours as needed for nausea or vomiting.  . sodium bicarbonate 650 MG tablet Take 650 mg by mouth 2 (two) times daily.   Marland Kitchen warfarin (  COUMADIN) 5 MG tablet   . [DISCONTINUED] chlorpheniramine-HYDROcodone (TUSSIONEX) 10-8 MG/5ML SUER Take 5 mLs by mouth every 12 (twelve) hours as needed for cough.   Facility-Administered Encounter Medications as of 10/04/2017  Medication  . [COMPLETED] traMADol (ULTRAM) tablet 50 mg    ALLERGIES:  Allergies  Allergen Reactions  . Sulfonamide Derivatives Rash     PHYSICAL EXAM:  ECOG Performance status: 1  Vitals:   10/04/17 0940  BP: 125/82  Pulse: 80  Resp: 14  Temp: 97.8 F (36.6 C)  SpO2: 100%   Filed Weights   10/04/17 0940  Weight: 84 lb 9.6 oz (38.4 kg)    Physical Exam   LABORATORY DATA:  I have reviewed the labs as listed.  CBC    Component Value Date/Time   WBC 4.9 10/04/2017 0904   RBC 3.27 (L) 10/04/2017 0904   HGB 10.0 (L) 10/04/2017 0904   HCT 29.8 (L) 10/04/2017 0904   PLT 228 10/04/2017 0904   MCV 91.1 10/04/2017 0904   MCH 30.6 10/04/2017 0904   MCHC 33.6 10/04/2017 0904   RDW 15.3 10/04/2017 0904   LYMPHSABS 0.5 (L) 10/04/2017 0904   MONOABS 0.2 10/04/2017 0904   EOSABS 0.0 10/04/2017 0904   BASOSABS 0.0 10/04/2017 0904   CMP Latest Ref Rng & Units 10/04/2017 09/24/2017 09/22/2017  Glucose 70 - 99 mg/dL 102(H) 93 98  BUN 6 - 20 mg/dL 66(H) 55(H) 63(H)  Creatinine 0.44 - 1.00 mg/dL 4.59(H) 4.36(H) 3.99(H)  Sodium 135 - 145 mmol/L 130(L) 129(L) 129(L)  Potassium 3.5 - 5.1 mmol/L 5.1 5.1 4.7  Chloride 98 - 111 mmol/L 94(L) 94(L) 92(L)  CO2 22 - 32 mmol/L 25 25 26  Calcium 8.9 - 10.3 mg/dL 9.2 9.3 9.3  Total Protein 6.5 - 8.1 g/dL 6.1(L) 6.5 6.2(L)  Total Bilirubin 0.3 - 1.2 mg/dL 0.6 0.6 0.3  Alkaline Phos 38 - 126 U/L  66 76 55  AST 15 - 41 U/L 20 21 20  ALT 0 - 44 U/L 11 12 12        ASSESSMENT & PLAN:   Malignant neoplasm of lung (HCC) 1.  Clinical stage III (T4N1) right lung poorly differentiated adenocarcinoma, EGFR negative: -Patient underwent a CT scan of the chest for work-up of kidney transplant at [facility] and was found to have spiculated solid nodule in the posterior right lower lobe measuring 1.6 x 1.2 cm with fullness in the right hilum.  On 06/15/2011 she underwent EBUS guided biopsy.  7S lymph node was negative for malignancy.  Right hilar mass was positive for carcinoma.  Endobronchial biopsy showed poorly differentiated adenocarcinoma.  She also had endobronchial tumor debulked (75% obstructing reduced to 25% obstructing). BI had tumor 1.5 cm distal to right upper lobe takeoff.  Full EBUS staging shows no adenopathy at 11 L, 10 L, 4L or 4R.  Left mainstem and segments clear.  Right upper lobe normal.  - Her hemoptysis has improved since she stopped taking Coumadin.  She takes aspirin daily.  She is on Coumadin for vasculitis and strokes. - PET CT scan dated 07/05/2017 shows 1.5 cm medial right lower lobe nodule, and right perihilar mass with no evidence of distant metastasis.  We also talked about MRI of the brain without contrast dated 07/07/2017 which did not show any intracranial metastasis. -She saw Dr. Hendrickson for opinion regarding surgery.  Dr. Hendrickson did not think she would be a surgical candidate.  He also thought the tumor is encroaching the mediastinum. -   Hemoptysis has improved over the last few days.  Complains of occasional vague chest pain which is nagging in the center of the chest.  She also complained of pain in her right shoulder and has difficulty putting it up when she is having radiation therapy.  She reportedly had neck surgery with resulting chronic right shoulder pain in certain positions.  I have given a prescription for tramadol 50 mg to be taken an hour before  the radiation.  She tried taking ibuprofen, but we are concerned about her kidney function. -She started radiation therapy on 08/18/2017.  She tolerated her first weekly dose of carboplatin and paclitaxel very well.  Her week 2 of treatment was on 08/25/2017.  Her week 3 treatment on 09/01/2017 was held due to low white count.  She also had diarrhea which resolved.  C. difficile was negative. - She received week 3 on 09/08/2017. - She received cycle 4 of weekly paclitaxel and carboplatin on 09/27/2017.  She tolerated it very well.  Today her blood counts are adequate to proceed with week 5 of treatment.  This will be her final treatment.  I plan to repeat a PET CT scan in 4 weeks.  We plan to start consolidation immunotherapy with durvalumab within 6 weeks of completion of chemo and radiation therapy based on PACIFIC trial.  2.  CKD: She has stage V CKD.  Hence she was being worked up for kidney transplant which is put on hold at this time.  She follows up with Dr. Elita Quick in [place].  3.  Anemia: -This is from a combination of chronic kidney disease and chemotherapy.  She received Feraheme on 09/01/2017.  She also received 2 units of blood transfusion last week.  Hemoglobin improved.  4.  Cough: - She is taking Tussionex which is helping her cough.  This is causing some constipation.  She will continue stool softener.      Orders placed this encounter:  Orders Placed This Encounter  Procedures  . NM PET Image Restag (PS) Skull Base To Thigh  . CBC with Differential/Platelet  . Comprehensive metabolic panel      Derek Jack, MD [place] [phone]

## 2017-10-04 NOTE — Patient Instructions (Addendum)
[facility] at [facility] Discharge Instructions  You saw Dr. Delton Coombes today. Please follow up with [place] in 4 weeks. You will have your PETScan before your next visit with labs.    Thank you for choosing [place] at [facility] to provide your oncology and hematology care.  To afford each patient quality time with our provider, please arrive at least 15 minutes before your scheduled appointment time.   If you have a lab appointment with the [facility] please come in thru the  Main Entrance and check in at the main information desk  You need to re-schedule your appointment should you arrive 10 or more minutes late.  We strive to give you quality time with our providers, and arriving late affects you and other patients whose appointments are after yours.  Also, if you no show three or more times for appointments you may be dismissed from the clinic at the providers discretion.     Again, thank you for choosing [facility].  Our hope is that these requests will decrease the amount of time that you wait before being seen by our physicians.       _____________________________________________________________  Should you have questions after your visit to [facility], please contact our office at [phone] between the hours of 8:00 a.m. and 4:30 p.m.  Voicemails left after 4:00 p.m. will not be returned until the following business day.  For prescription refill requests, have your pharmacy contact our office and allow 72 hours.    Cancer Center Support Programs:   > Cancer Support Group  2nd Tuesday of the month 1pm-2pm, Journey Room

## 2017-10-04 NOTE — Patient Instructions (Signed)
[facility] Discharge Instructions for Patients Receiving Chemotherapy   Beginning January 23rd 2017 lab work for the Cancer Center will be done in the  Main lab at [facility] on 1st floor. If you have a lab appointment with the Cancer Center please come in thru the  Main Entrance and check in at the main information desk   Today you received the following chemotherapy agents   To help prevent nausea and vomiting after your treatment, we encourage you to take your nausea medication     If you develop nausea and vomiting, or diarrhea that is not controlled by your medication, call the clinic.  The clinic phone number is [phone]. Office hours are Monday-Friday 8:30am-5:00pm.  BELOW ARE SYMPTOMS THAT SHOULD BE REPORTED IMMEDIATELY:  *FEVER GREATER THAN 101.0 F  *CHILLS WITH OR WITHOUT FEVER  NAUSEA AND VOMITING THAT IS NOT CONTROLLED WITH YOUR NAUSEA MEDICATION  *UNUSUAL SHORTNESS OF BREATH  *UNUSUAL BRUISING OR BLEEDING  TENDERNESS IN MOUTH AND THROAT WITH OR WITHOUT PRESENCE OF ULCERS  *URINARY PROBLEMS  *BOWEL PROBLEMS  UNUSUAL RASH Items with * indicate a potential emergency and should be followed up as soon as possible. If you have an emergency after office hours please contact your primary care physician or go to the nearest emergency department.  Please call the clinic during office hours if you have any questions or concerns.   You may also contact the Patient Navigator at [phone] should you have any questions or need assistance in obtaining follow up care.      Resources For Cancer Patients and their Caregivers ? American Cancer Society: Can assist with transportation, wigs, general needs, runs Look Good Feel Better.        1-888-227-6333 ? Cancer Care: Provides financial assistance, online support groups, medication/co-pay assistance.  1-800-813-HOPE (4673) ? [facility] Assists [place] cancer  patients and their families through emotional , educational and financial support.  336-427-4357 ? [place] DSS Where to apply for food stamps, Medicaid and utility assistance. 336-342-1394 ? [facility]: Transportation to medical appointments. 336-347-2287 ? Social Security Administration: May apply for disability if have a Stage IV cancer. 336-342-7796 1-800-772-1213 ? [place] Aging, Disability and Transit Services: Assists with nutrition, care and transit needs. 336-349-2343         

## 2017-10-04 NOTE — Assessment & Plan Note (Signed)
1.  Clinical stage III (T4N1) right lung poorly differentiated adenocarcinoma, EGFR negative: -Patient underwent a CT scan of the chest for work-up of kidney transplant at [facility] and was found to have spiculated solid nodule in the posterior right lower lobe measuring 1.6 x 1.2 cm with fullness in the right hilum.  On 06/15/2011 she underwent EBUS guided biopsy.  7S lymph node was negative for malignancy.  Right hilar mass was positive for carcinoma.  Endobronchial biopsy showed poorly differentiated adenocarcinoma.  She also had endobronchial tumor debulked (75% obstructing reduced to 25% obstructing). BI had tumor 1.5 cm distal to right upper lobe takeoff.  Full EBUS staging shows no adenopathy at 11 L, 10 L, 4L or 4R.  Left mainstem and segments clear.  Right upper lobe normal.  - Her hemoptysis has improved since she stopped taking Coumadin.  She takes aspirin daily.  She is on Coumadin for vasculitis and strokes. - PET CT scan dated 07/05/2017 shows 1.5 cm medial right lower lobe nodule, and right perihilar mass with no evidence of distant metastasis.  We also talked about MRI of the brain without contrast dated 07/07/2017 which did not show any intracranial metastasis. -She saw Dr. Roxan Hockey for opinion regarding surgery.  Dr. Roxan Hockey did not think she would be a surgical candidate.  He also thought the tumor is encroaching the mediastinum. - Hemoptysis has improved over the last few days.  Complains of occasional vague chest pain which is nagging in the center of the chest.  She also complained of pain in her right shoulder and has difficulty putting it up when she is having radiation therapy.  She reportedly had neck surgery with resulting chronic right shoulder pain in certain positions.  I have given a prescription for tramadol 50 mg to be taken an hour before the radiation.  She tried taking ibuprofen, but we are concerned about her kidney function. -She started radiation therapy on  08/18/2017.  She tolerated her first weekly dose of carboplatin and paclitaxel very well.  Her week 2 of treatment was on 08/25/2017.  Her week 3 treatment on 09/01/2017 was held due to low white count.  She also had diarrhea which resolved.  C. difficile was negative. - She received week 3 on 09/08/2017. - She received cycle 4 of weekly paclitaxel and carboplatin on 09/27/2017.  She tolerated it very well.  Today her blood counts are adequate to proceed with week 5 of treatment.  This will be her final treatment.  I plan to repeat a PET CT scan in 4 weeks.  We plan to start consolidation immunotherapy with durvalumab within 6 weeks of completion of chemo and radiation therapy based on PACIFIC trial.  2.  CKD: She has stage V CKD.  Hence she was being worked up for kidney transplant which is put on hold at this time.  She follows up with Dr. Elita Quick in [place].  3.  Anemia: -This is from a combination of chronic kidney disease and chemotherapy.  She received Feraheme on 09/01/2017.  She also received 2 units of blood transfusion last week.  Hemoglobin improved.  4.  Cough: - She is taking Tussionex which is helping her cough.  This is causing some constipation.  She will continue stool softener.

## 2017-10-04 NOTE — Progress Notes (Signed)
Labs reviewed during office visit this morning. This will be her last treatment today per MD.Proceed with treatment today.   Treatment given per orders. Patient tolerated it well without problems. Vitals stable and discharged home from clinic ambulatory. Follow up as scheduled.

## 2017-10-14 ENCOUNTER — Inpatient Hospital Stay (HOSPITAL_COMMUNITY): Payer: Managed Care, Other (non HMO)

## 2017-10-14 ENCOUNTER — Other Ambulatory Visit (HOSPITAL_COMMUNITY): Payer: Self-pay | Admitting: Nurse Practitioner

## 2017-10-14 DIAGNOSIS — C3431 Malignant neoplasm of lower lobe, right bronchus or lung: Secondary | ICD-10-CM

## 2017-10-14 DIAGNOSIS — Z5111 Encounter for antineoplastic chemotherapy: Secondary | ICD-10-CM | POA: Diagnosis not present

## 2017-10-14 LAB — COMPREHENSIVE METABOLIC PANEL
ALK PHOS: 70 U/L (ref 38–126)
ALT: 11 U/L (ref 0–44)
ANION GAP: 13 (ref 5–15)
AST: 25 U/L (ref 15–41)
Albumin: 3.1 g/dL — ABNORMAL LOW (ref 3.5–5.0)
BILIRUBIN TOTAL: 0.5 mg/dL (ref 0.3–1.2)
BUN: 49 mg/dL — ABNORMAL HIGH (ref 6–20)
CALCIUM: 9.4 mg/dL (ref 8.9–10.3)
CO2: 24 mmol/L (ref 22–32)
Chloride: 84 mmol/L — ABNORMAL LOW (ref 98–111)
Creatinine, Ser: 4.58 mg/dL — ABNORMAL HIGH (ref 0.44–1.00)
GFR, EST AFRICAN AMERICAN: 12 mL/min — AB (ref >60)
GFR, EST NON AFRICAN AMERICAN: 10 mL/min — AB (ref >60)
GLUCOSE: 109 mg/dL — AB (ref 70–99)
Potassium: 3.9 mmol/L (ref 3.5–5.1)
Sodium: 121 mmol/L — ABNORMAL LOW (ref 135–145)
TOTAL PROTEIN: 6.6 g/dL (ref 6.5–8.1)

## 2017-10-14 LAB — CBC WITH DIFFERENTIAL/PLATELET
Basophils Absolute: 0 10*3/uL (ref 0.0–0.1)
Basophils Relative: 0 %
Eosinophils Absolute: 0 10*3/uL (ref 0.0–0.7)
Eosinophils Relative: 1 %
HEMATOCRIT: 24.8 % — AB (ref 36.0–46.0)
HEMOGLOBIN: 8.9 g/dL — AB (ref 12.0–15.0)
Lymphocytes Relative: 15 %
Lymphs Abs: 0.5 10*3/uL — ABNORMAL LOW (ref 0.7–4.0)
MCH: 31.4 pg (ref 26.0–34.0)
MCHC: 35.9 g/dL (ref 30.0–36.0)
MCV: 87.6 fL (ref 78.0–100.0)
MONO ABS: 0.5 10*3/uL (ref 0.1–1.0)
MONOS PCT: 16 %
NEUTROS ABS: 2 10*3/uL (ref 1.7–7.7)
NEUTROS PCT: 68 %
Platelets: 110 10*3/uL — ABNORMAL LOW (ref 150–400)
RBC: 2.83 MIL/uL — ABNORMAL LOW (ref 3.87–5.11)
RDW: 14.7 % (ref 11.5–15.5)
WBC: 2.9 10*3/uL — ABNORMAL LOW (ref 4.0–10.5)

## 2017-10-28 ENCOUNTER — Telehealth (HOSPITAL_COMMUNITY): Payer: Self-pay | Admitting: Hematology

## 2017-10-28 ENCOUNTER — Ambulatory Visit (HOSPITAL_COMMUNITY)
Admission: RE | Admit: 2017-10-28 | Discharge: 2017-10-28 | Disposition: A | Discharge status: Home / Self Care | Payer: Medicaid - Out of State | Source: Ambulatory Visit | Attending: Nurse Practitioner | Admitting: Nurse Practitioner

## 2017-10-28 DIAGNOSIS — C3431 Malignant neoplasm of lower lobe, right bronchus or lung: Secondary | ICD-10-CM

## 2017-10-28 LAB — GLUCOSE, CAPILLARY: GLUCOSE-CAPILLARY: 95 mg/dL (ref 70–99)

## 2017-10-28 MED ORDER — FLUDEOXYGLUCOSE F - 18 (FDG) INJECTION
4.9700 | Freq: Once | INTRAVENOUS | Status: AC | PRN
  Administered 2017-10-28: 4.97 via INTRAVENOUS

## 2017-10-28 NOTE — Telephone Encounter (Signed)
Per [place] PETs etc performed oos are not covered. Must be done by a [facility] provider or facility.  CONTACT INFO:  [facility]  [phone](T)

## 2017-10-29 ENCOUNTER — Ambulatory Visit (HOSPITAL_COMMUNITY): Payer: Managed Care, Other (non HMO) | Admitting: Hematology

## 2017-10-29 ENCOUNTER — Inpatient Hospital Stay (HOSPITAL_COMMUNITY): Payer: Managed Care, Other (non HMO) | Attending: Nurse Practitioner

## 2017-10-29 ENCOUNTER — Inpatient Hospital Stay (HOSPITAL_BASED_OUTPATIENT_CLINIC_OR_DEPARTMENT_OTHER): Payer: Managed Care, Other (non HMO) | Admitting: Hematology

## 2017-10-29 ENCOUNTER — Other Ambulatory Visit (HOSPITAL_COMMUNITY): Payer: Managed Care, Other (non HMO)

## 2017-10-29 ENCOUNTER — Encounter (HOSPITAL_COMMUNITY): Payer: Self-pay | Admitting: Hematology

## 2017-10-29 ENCOUNTER — Other Ambulatory Visit: Payer: Self-pay

## 2017-10-29 VITALS — BP 124/72 | HR 96 | Temp 98.0°F | Resp 16 | Wt 88.7 lb

## 2017-10-29 DIAGNOSIS — C3401 Malignant neoplasm of right main bronchus: Secondary | ICD-10-CM | POA: Insufficient documentation

## 2017-10-29 DIAGNOSIS — D631 Anemia in chronic kidney disease: Secondary | ICD-10-CM | POA: Insufficient documentation

## 2017-10-29 DIAGNOSIS — Z79899 Other long term (current) drug therapy: Secondary | ICD-10-CM | POA: Insufficient documentation

## 2017-10-29 DIAGNOSIS — C3431 Malignant neoplasm of lower lobe, right bronchus or lung: Secondary | ICD-10-CM

## 2017-10-29 DIAGNOSIS — N185 Chronic kidney disease, stage 5: Secondary | ICD-10-CM | POA: Diagnosis not present

## 2017-10-29 DIAGNOSIS — C3490 Malignant neoplasm of unspecified part of unspecified bronchus or lung: Secondary | ICD-10-CM

## 2017-10-29 LAB — COMPREHENSIVE METABOLIC PANEL
ALT: 10 U/L (ref 0–44)
AST: 20 U/L (ref 15–41)
Albumin: 2.6 g/dL — ABNORMAL LOW (ref 3.5–5.0)
Alkaline Phosphatase: 68 U/L (ref 38–126)
Anion gap: 9 (ref 5–15)
BUN: 47 mg/dL — ABNORMAL HIGH (ref 6–20)
CHLORIDE: 102 mmol/L (ref 98–111)
CO2: 19 mmol/L — AB (ref 22–32)
Calcium: 9.4 mg/dL (ref 8.9–10.3)
Creatinine, Ser: 4.52 mg/dL — ABNORMAL HIGH (ref 0.44–1.00)
GFR calc non Af Amer: 10 mL/min — ABNORMAL LOW (ref >60)
GFR, EST AFRICAN AMERICAN: 12 mL/min — AB (ref >60)
Glucose, Bld: 100 mg/dL — ABNORMAL HIGH (ref 70–99)
Potassium: 4.7 mmol/L (ref 3.5–5.1)
SODIUM: 130 mmol/L — AB (ref 135–145)
Total Bilirubin: 0.3 mg/dL (ref 0.3–1.2)
Total Protein: 6.1 g/dL — ABNORMAL LOW (ref 6.5–8.1)

## 2017-10-29 LAB — CBC WITH DIFFERENTIAL/PLATELET
Basophils Absolute: 0 10*3/uL (ref 0.0–0.1)
Basophils Relative: 0 %
EOS ABS: 0.2 10*3/uL (ref 0.0–0.7)
Eosinophils Relative: 4 %
HCT: 37.9 % (ref 36.0–46.0)
Hemoglobin: 13 g/dL (ref 12.0–15.0)
LYMPHS ABS: 0.6 10*3/uL — AB (ref 0.7–4.0)
Lymphocytes Relative: 16 %
MCH: 31 pg (ref 26.0–34.0)
MCHC: 34.3 g/dL (ref 30.0–36.0)
MCV: 90.2 fL (ref 78.0–100.0)
MONOS PCT: 6 %
Monocytes Absolute: 0.2 10*3/uL (ref 0.1–1.0)
Neutro Abs: 2.9 10*3/uL (ref 1.7–7.7)
Neutrophils Relative %: 74 %
PLATELETS: 36 10*3/uL — AB (ref 150–400)
RBC: 4.2 MIL/uL (ref 3.87–5.11)
RDW: 14.3 % (ref 11.5–15.5)
WBC: 3.9 10*3/uL — AB (ref 4.0–10.5)

## 2017-10-29 NOTE — Progress Notes (Signed)
[place], [place] [address]   CLINIC:  Medical Oncology/Hematology  PCP:  Glenda Chroman, MD [place] [id number] [phone]   REASON FOR VISIT:  Follow-up for stage 3 lung cancer  CURRENT THERAPY: Finished chemoradiation waiting to start [place] HISTORY:    Malignant neoplasm of lung ([place])   06/30/2017 Initial Diagnosis    Malignant neoplasm of bronchus and lung ([place])    08/15/2017 -  Chemotherapy    The patient had palonosetron (ALOXI) injection 0.25 mg, 0.25 mg, Intravenous,  Once, 5 of 5 cycles Administration: 0.25 mg (08/18/2017), 0.25 mg (08/25/2017), 0.25 mg (09/08/2017), 0.25 mg (09/27/2017), 0.25 mg (10/04/2017) CARBOplatin (PARAPLATIN) 70 mg in sodium chloride 0.9 % 100 mL chemo infusion, 70 mg (100 % of original dose 67.6 mg), Intravenous,  Once, 5 of 5 cycles Dose modification:   (original dose 67.6 mg, Cycle 1),   (original dose 68.6 mg, Cycle 2),   (original dose 70.8 mg, Cycle 3),   (original dose 68.6 mg, Cycle 4) Administration: 70 mg (08/18/2017), 70 mg (08/25/2017), 70 mg (09/08/2017), 70 mg (09/27/2017), 70 mg (10/04/2017) PACLitaxel (TAXOL) 60 mg in sodium chloride 0.9 % 150 mL chemo infusion (</= 25m/m2), 45 mg/m2 = 60 mg, Intravenous,  Once, 5 of 5 cycles Administration: 60 mg (08/18/2017), 60 mg (08/25/2017), 60 mg (09/08/2017), 60 mg (09/27/2017), 60 mg (10/04/2017)  for chemotherapy treatment.       CANCER STAGING: Cancer Staging No matching staging information was found for the patient.   INTERVAL HISTORY:  Ms. BLitts89y.o. female returns for routine follow-up for stage III lung cancer. Patient is here today with her husband. She is doing well since finishing all her treatment. She reports her appetite 100% and she is maintaining her weight better now. Her energy level is 75%. She is trying to remain active at home. Denies any hemoptysis. Denies any nausea, vomiting, diarrhea. Denies any throat pain.  Denies any cough or SOB.     REVIEW OF SYSTEMS:  Review of Systems  Constitutional: Positive for fatigue.  All other systems reviewed and are negative.    PAST MEDICAL/SURGICAL HISTORY:  Past Medical History:  Diagnosis Date  . Anemia   . Aortic insufficiency   . Asthma   . Breast nodule   . Carotid artery disease ([place]   . 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 CKD (chronic kidney disease) stage 4, GFR 15-29 ml/min (HCC)   . COPD (chronic obstructive pulmonary disease) ([place]   . CRI (chronic renal insufficiency)   . Folic acid deficiency   . Glaucoma   . Graves disease   . Hyperlipidemia   . Hypertension   . Iron deficiency   . Lung cancer ([place]    non-small cell right lung  . Recurrent UTI   . Renal cyst   . Systemic vasculitis ([facility]    Not otherwise specified, Dr. KSonny Dandy  Past Surgical History:  Procedure Laterality Date  . BRONCHOSCOPY    . COLONOSCOPY    . ORIF ORBITAL FRACTURE  1996   Left  . PORTACATH PLACEMENT Left 08/25/2017   Procedure: INSERTION PORT-A-CATH;  Surgeon: JAviva Signs MD;  Location: [facility];  Service: General;  Laterality: Left;  . SPINAL FUSION  1994   At C3-5     SOCIAL HISTORY:  Social History   Socioeconomic History  . Marital status: Married  Spouse name: Not on file  . Number of children: Not on file  . Years of education: Not on file  . Highest education level: Not on file  Occupational History  . Not on file  Social Needs  . Financial resource strain: Not on file  . Food insecurity:    Worry: Not on file    Inability: Not on file  . Transportation needs:    Medical: Not on file    Non-medical: Not on file  Tobacco Use  . Smoking status: Former Smoker    Years: 25.00    Types: Cigarettes    Last attempt to quit: 02/24/2008    Years since quitting: 9.6  . Smokeless tobacco: Never Used  Substance and Sexual Activity  . Alcohol use: Not Currently  . Drug use: No  . Sexual  activity: Not on file  Lifestyle  . Physical activity:    Days per week: Not on file    Minutes per session: Not on file  . Stress: Not on file  Relationships  . Social connections:    Talks on phone: Not on file    Gets together: Not on file    Attends religious service: Not on file    Active member of club or organization: Not on file    Attends meetings of clubs or organizations: Not on file    Relationship status: Not on file  . Intimate partner violence:    Fear of current or ex partner: Not on file    Emotionally abused: Not on file    Physically abused: Not on file    Forced sexual activity: Not on file  Other Topics Concern  . Not on file  Social History Narrative   No regular exercise.    FAMILY HISTORY:  Family History  Problem Relation Age of Onset  . Heart attack Father   . Diverticulitis Mother   . Fibromyalgia Mother   . Hypertension Brother     CURRENT MEDICATIONS:  Outpatient Encounter Medications as of 10/29/2017  Medication Sig  . albuterol (PROAIR HFA) 108 (90 BASE) MCG/ACT inhaler Inhale 2 puffs into the lungs every 4 (four) hours as needed for wheezing or shortness of breath.   Marland Kitchen amLODipine (NORVASC) 10 MG tablet Take 10 mg by mouth daily.  . calcitRIOL (ROCALTROL) 0.25 MCG capsule Take 0.25 mcg by mouth daily.   . chlorpheniramine-HYDROcodone (TUSSIONEX) 10-8 MG/5ML SUER Take 5 mLs by mouth every 12 (twelve) hours as needed for cough.  . folic acid (FOLVITE) 1 MG tablet Take 1 mg by mouth daily.    Marland Kitchen HYDROcodone-acetaminophen (NORCO) 5-325 MG tablet Take 1 tablet by mouth every 6 (six) hours as needed for moderate pain.  Marland Kitchen lidocaine-prilocaine (EMLA) cream Apply a quarter size amount to port site 1 hour prior to chemo. Do not rub in. Cover with plastic wrap.  . metoprolol tartrate (LOPRESSOR) 25 MG tablet Take 25 mg by mouth 2 (two) times daily.   . prochlorperazine (COMPAZINE) 10 MG tablet Take 1 tablet (10 mg total) by mouth every 6 (six) hours as  needed for nausea or vomiting.  . sodium bicarbonate 650 MG tablet Take 650 mg by mouth 2 (two) times daily.   Marland Kitchen warfarin (COUMADIN) 5 MG tablet   . [DISCONTINUED] aspirin EC 325 MG tablet Take 162.5-325 mg by mouth See admin instructions. Take 325 mg by mouth every other day alternating with 162.5 mg by mouth every other day  . [DISCONTINUED] ferrous sulfate 325 (65  FE) MG tablet Take 325 mg by mouth daily.     No facility-administered encounter medications on file as of 10/29/2017.     ALLERGIES:  Allergies  Allergen Reactions  . Sulfonamide Derivatives Rash     PHYSICAL EXAM:  ECOG Performance status: 1  Vitals:   10/29/17 1157  BP: 124/72  Pulse: 96  Resp: 16  Temp: 98 F (36.7 C)  SpO2: 100%   Filed Weights   10/29/17 1157  Weight: 88 lb 11.2 oz (40.2 kg)    Physical Exam  Constitutional: She is oriented to person, place, and time.  Cardiovascular: Regular rhythm.  Neurological: She is alert and oriented to person, place, and time.  Skin: Skin is warm and dry.  Psychiatric: She has a normal mood and affect. Her behavior is normal. Judgment and thought content normal.     LABORATORY DATA:  I have reviewed the labs as listed.  CBC    Component Value Date/Time   WBC 3.9 (L) 10/29/2017 1121   RBC 4.20 10/29/2017 1121   HGB 13.0 10/29/2017 1121   HCT 37.9 10/29/2017 1121   PLT 36 (L) 10/29/2017 1121   MCV 90.2 10/29/2017 1121   MCH 31.0 10/29/2017 1121   MCHC 34.3 10/29/2017 1121   RDW 14.3 10/29/2017 1121   LYMPHSABS 0.6 (L) 10/29/2017 1121   MONOABS 0.2 10/29/2017 1121   EOSABS 0.2 10/29/2017 1121   BASOSABS 0.0 10/29/2017 1121   CMP Latest Ref Rng & Units 10/29/2017 10/14/2017 10/04/2017  Glucose 70 - 99 mg/dL 100(H) 109(H) 102(H)  BUN 6 - 20 mg/dL 47(H) 49(H) 66(H)  Creatinine 0.44 - 1.00 mg/dL 4.52(H) 4.58(H) 4.59(H)  Sodium 135 - 145 mmol/L 130(L) 121(L) 130(L)  Potassium 3.5 - 5.1 mmol/L 4.7 3.9 5.1  Chloride 98 - 111 mmol/L 102 84(L) 94(L)  CO2 22  - 32 mmol/L 19(L) 24 25  Calcium 8.9 - 10.3 mg/dL 9.4 9.4 9.2  Total Protein 6.5 - 8.1 g/dL 6.1(L) 6.6 6.1(L)  Total Bilirubin 0.3 - 1.2 mg/dL 0.3 0.5 0.6  Alkaline Phos 38 - 126 U/L 68 70 66  AST 15 - 41 U/L _0 ALT 0 - 44 U/L _1 DIAGNOSTIC IMAGING:  I have personally reviewed images of the PET CT scan with the patient and her husband.     ASSESSMENT & PLAN:   Malignant neoplasm of lung ([place]) 1.  Clinical stage III (T4N1) right lung poorly differentiated adenocarcinoma, EGFR negative: -Patient underwent a CT scan of the chest for work-up of kidney transplant at [facility] and was found to have spiculated solid nodule in the posterior right lower lobe measuring 1.6 x 1.2 cm with fullness in the right hilum.  On 06/15/2011 she underwent EBUS guided biopsy.  7S lymph node was negative for malignancy.  Right hilar mass was positive for carcinoma.  Endobronchial biopsy showed poorly differentiated adenocarcinoma.  She also had endobronchial tumor debulked (75% obstructing reduced to 25% obstructing). BI had tumor 1.5 cm distal to right upper lobe takeoff.  Full EBUS staging shows no adenopathy at 11 L, 10 L, 4L or 4R.  Left mainstem and segments clear.  Right upper lobe normal.  - Her hemoptysis has improved since she stopped taking Coumadin.  She takes aspirin daily.  She is on Coumadin for vasculitis and strokes. - PET CT scan dated 07/05/2017 shows 1.5 cm medial right lower lobe nodule, and right perihilar mass with no evidence of  distant metastasis.  We also talked about MRI of the brain without contrast dated 07/07/2017 which did not show any intracranial metastasis. - Chemoradiation therapy with weekly carboplatin and paclitaxel from 08/18/2017 through 10/04/2017 -We discussed the PET CT scan results dated 10/28/2017 which showed very good response to chemoradiation therapy.  She had very minimal residual SUV of 2.7 in the right perihilar mass, which could be therapy  related. - Her platelet count today is 36,000.  She was recently admitted to the [facility] over the weekend with a hemoglobin of 4 and was given 4 units of blood transfusion.  She is feeling better at this time.  I plan to check her blood work in 2 weeks. We plan to start consolidation immunotherapy with durvalumab within 6 weeks of completion of chemo and radiation therapy based on PACIFIC trial.  2.  CKD: She has stage V CKD.  Hence she was being worked up for kidney transplant which is put on hold at this time.  She follows up with Dr. Elita Quick in [place].  3.  Anemia: -This is from combination of CKD and chemotherapy.  She also received parenteral iron therapy on August 02, 2017. She had to receive 4 units of blood transfusion over the weekend in [facility].  4.  Cough: - She is taking Tussionex which is helping her cough.  This is causing some constipation.  She will continue stool softener.      Orders placed this encounter:  Orders Placed This Encounter  Procedures  . CBC with Differential/Platelet  . Comprehensive metabolic panel  . TSH      Derek Jack, [place] [phone]

## 2017-10-29 NOTE — Patient Instructions (Signed)
[facility] Discharge Instructions     Thank you for choosing [facility] at [facility] to provide your oncology and hematology care.  To afford each patient quality time with our provider, please arrive at least 15 minutes before your scheduled appointment time.   If you have a lab appointment with the Cancer Center please come in thru the  Main Entrance and check in at the main information desk  You need to re-schedule your appointment should you arrive 10 or more minutes late.  We strive to give you quality time with our providers, and arriving late affects you and other patients whose appointments are after yours.  Also, if you no show three or more times for appointments you may be dismissed from the clinic at the providers discretion.     Again, thank you for choosing [facility].  Our hope is that these requests will decrease the amount of time that you wait before being seen by our physicians.       _____________________________________________________________  Should you have questions after your visit to [facility], please contact our office at [phone] between the hours of 8:00 a.m. and 4:30 p.m.  Voicemails left after 4:00 p.m. will not be returned until the following business day.  For prescription refill requests, have your pharmacy contact our office and allow 72 hours.    Cancer Center Support Programs:   > Cancer Support Group  2nd Tuesday of the month 1pm-2pm, Journey Room    

## 2017-10-29 NOTE — Assessment & Plan Note (Signed)
1.  Clinical stage III (T4N1) right lung poorly differentiated adenocarcinoma, EGFR negative: -Patient underwent a CT scan of the chest for work-up of kidney transplant at [facility] and was found to have spiculated solid nodule in the posterior right lower lobe measuring 1.6 x 1.2 cm with fullness in the right hilum.  On 06/15/2011 she underwent EBUS guided biopsy.  7S lymph node was negative for malignancy.  Right hilar mass was positive for carcinoma.  Endobronchial biopsy showed poorly differentiated adenocarcinoma.  She also had endobronchial tumor debulked (75% obstructing reduced to 25% obstructing). BI had tumor 1.5 cm distal to right upper lobe takeoff.  Full EBUS staging shows no adenopathy at 11 L, 10 L, 4L or 4R.  Left mainstem and segments clear.  Right upper lobe normal.  - Her hemoptysis has improved since she stopped taking Coumadin.  She takes aspirin daily.  She is on Coumadin for vasculitis and strokes. - PET CT scan dated 07/05/2017 shows 1.5 cm medial right lower lobe nodule, and right perihilar mass with no evidence of distant metastasis.  We also talked about MRI of the brain without contrast dated 07/07/2017 which did not show any intracranial metastasis. - Chemoradiation therapy with weekly carboplatin and paclitaxel from 08/18/2017 through 10/04/2017 -We discussed the PET CT scan results dated 10/28/2017 which showed very good response to chemoradiation therapy.  She had very minimal residual SUV of 2.7 in the right perihilar mass, which could be therapy related. - Her platelet count today is 36,000.  She was recently admitted to the [facility] over the weekend with a hemoglobin of 4 and was given 4 units of blood transfusion.  She is feeling better at this time.  I plan to check her blood work in 2 weeks. We plan to start consolidation immunotherapy with durvalumab within 6 weeks of completion of chemo and radiation therapy based on PACIFIC trial.  2.  CKD: She has stage V  CKD.  Hence she was being worked up for kidney transplant which is put on hold at this time.  She follows up with Dr. Elita Quick in [place].  3.  Anemia: -This is from combination of CKD and chemotherapy.  She also received parenteral iron therapy on August 02, 2017. She had to receive 4 units of blood transfusion over the weekend in [facility].  4.  Cough: - She is taking Tussionex which is helping her cough.  This is causing some constipation.  She will continue stool softener.

## 2017-11-12 ENCOUNTER — Other Ambulatory Visit (HOSPITAL_COMMUNITY): Payer: Managed Care, Other (non HMO)

## 2017-11-15 ENCOUNTER — Inpatient Hospital Stay (HOSPITAL_COMMUNITY): Payer: Managed Care, Other (non HMO)

## 2017-11-15 DIAGNOSIS — C3490 Malignant neoplasm of unspecified part of unspecified bronchus or lung: Secondary | ICD-10-CM

## 2017-11-15 DIAGNOSIS — C3401 Malignant neoplasm of right main bronchus: Secondary | ICD-10-CM | POA: Diagnosis not present

## 2017-11-15 LAB — COMPREHENSIVE METABOLIC PANEL
ALBUMIN: 3.5 g/dL (ref 3.5–5.0)
ALK PHOS: 84 U/L (ref 38–126)
ALT: 13 U/L (ref 0–44)
AST: 19 U/L (ref 15–41)
Anion gap: 12 (ref 5–15)
BUN: 53 mg/dL — ABNORMAL HIGH (ref 6–20)
CALCIUM: 9.7 mg/dL (ref 8.9–10.3)
CO2: 24 mmol/L (ref 22–32)
Chloride: 92 mmol/L — ABNORMAL LOW (ref 98–111)
Creatinine, Ser: 5.35 mg/dL — ABNORMAL HIGH (ref 0.44–1.00)
GFR calc Af Amer: 10 mL/min — ABNORMAL LOW (ref >60)
GFR calc non Af Amer: 8 mL/min — ABNORMAL LOW (ref >60)
GLUCOSE: 95 mg/dL (ref 70–99)
Potassium: 5.3 mmol/L — ABNORMAL HIGH (ref 3.5–5.1)
SODIUM: 128 mmol/L — AB (ref 135–145)
Total Bilirubin: 0.5 mg/dL (ref 0.3–1.2)
Total Protein: 7.3 g/dL (ref 6.5–8.1)

## 2017-11-15 LAB — CBC WITH DIFFERENTIAL/PLATELET
BASOS ABS: 0 10*3/uL (ref 0.0–0.1)
BASOS PCT: 0 %
EOS ABS: 0.1 10*3/uL (ref 0.0–0.7)
Eosinophils Relative: 2 %
HCT: 35.9 % — ABNORMAL LOW (ref 36.0–46.0)
HEMOGLOBIN: 12.1 g/dL (ref 12.0–15.0)
Lymphocytes Relative: 13 %
Lymphs Abs: 0.7 10*3/uL (ref 0.7–4.0)
MCH: 30.6 pg (ref 26.0–34.0)
MCHC: 33.7 g/dL (ref 30.0–36.0)
MCV: 90.9 fL (ref 78.0–100.0)
Monocytes Absolute: 0.6 10*3/uL (ref 0.1–1.0)
Monocytes Relative: 11 %
NEUTROS PCT: 74 %
Neutro Abs: 4.1 10*3/uL (ref 1.7–7.7)
Platelets: 323 10*3/uL (ref 150–400)
RBC: 3.95 MIL/uL (ref 3.87–5.11)
RDW: 15 % (ref 11.5–15.5)
WBC: 5.5 10*3/uL (ref 4.0–10.5)

## 2017-11-15 LAB — TSH: TSH: 2.984 u[IU]/mL (ref 0.350–4.500)

## 2017-11-17 ENCOUNTER — Other Ambulatory Visit (HOSPITAL_COMMUNITY): Payer: Self-pay | Admitting: *Deleted

## 2017-11-18 NOTE — Progress Notes (Signed)
[place], [place] [address]   CLINIC:  Medical Oncology/Hematology  PCP:  Glenda Chroman, MD [place]  [id number] [phone]   REASON FOR VISIT: Follow-up for stage 3 lung cancer  CURRENT THERAPY: starting Imfinizi   BRIEF ONCOLOGIC HISTORY:    Malignant neoplasm of lung ([place])   06/30/2017 Initial Diagnosis    Malignant neoplasm of bronchus and lung ([place])    08/15/2017 - 10/22/2017 Chemotherapy    The patient had palonosetron (ALOXI) injection 0.25 mg, 0.25 mg, Intravenous,  Once, 5 of 5 cycles Administration: 0.25 mg (08/18/2017), 0.25 mg (08/25/2017), 0.25 mg (09/08/2017), 0.25 mg (09/27/2017), 0.25 mg (10/04/2017) CARBOplatin (PARAPLATIN) 70 mg in sodium chloride 0.9 % 100 mL chemo infusion, 70 mg (100 % of original dose 67.6 mg), Intravenous,  Once, 5 of 5 cycles Dose modification:   (original dose 67.6 mg, Cycle 1),   (original dose 68.6 mg, Cycle 2),   (original dose 70.8 mg, Cycle 3),   (original dose 68.6 mg, Cycle 4) Administration: 70 mg (08/18/2017), 70 mg (08/25/2017), 70 mg (09/08/2017), 70 mg (09/27/2017), 70 mg (10/04/2017) PACLitaxel (TAXOL) 60 mg in sodium chloride 0.9 % 150 mL chemo infusion (</= 33m/m2), 45 mg/m2 = 60 mg, Intravenous,  Once, 5 of 5 cycles Administration: 60 mg (08/18/2017), 60 mg (08/25/2017), 60 mg (09/08/2017), 60 mg (09/27/2017), 60 mg (10/04/2017)  for chemotherapy treatment.     11/18/2017 -  Chemotherapy    The patient had durvalumab (IMFINZI) 380 mg in sodium chloride 0.9 % 100 mL chemo infusion, 10 mg/kg = 380 mg, Intravenous,  Once, 0 of 6 cycles  for chemotherapy treatment.        INTERVAL HISTORY:  [name]89y.o. female returns for routine follow-up for stage 3 lung cancer. Patient is here today with her husband. She is starting Imfinizi today. She has been doing well since chemo and radiation. She does have occasional nausea. Her cough is better still using her cough medication PRN at bedtime. Her  appetite is 100% and she is maintaining her weight. Her energy level is 75%. She denies any new pains. Denies any vomiting or diarrhea. Denies any hemoptysis. She is requesting her flu shot today.    REVIEW OF SYSTEMS:  Review of Systems  Gastrointestinal: Positive for nausea.  All other systems reviewed and are negative.    PAST MEDICAL/SURGICAL HISTORY:  Past Medical History:  Diagnosis Date  . Anemia   . Aortic insufficiency   . Asthma   . Breast nodule   . Carotid artery disease ([facility]   . 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 CKD (chronic kidney disease) stage 4, GFR 15-29 ml/min (HCC)   . COPD (chronic obstructive pulmonary disease) ([facility]   . CRI (chronic renal insufficiency)   . Folic acid deficiency   . Glaucoma   . Graves disease   . Hyperlipidemia   . Hypertension   . Iron deficiency   . Lung cancer ([facility]    non-small cell right lung  . Recurrent UTI   . Renal cyst   . Systemic vasculitis ([facility]    Not otherwise specified, Dr. KSonny Dandy  Past Surgical History:  Procedure Laterality Date  . BRONCHOSCOPY    . COLONOSCOPY    . ORIF ORBITAL FRACTURE  1996   Left  . PORTACATH PLACEMENT Left 08/25/2017   Procedure: INSERTION PORT-A-CATH;  Surgeon: JAviva Signs MD;  Location: [facility];  Service: General;  Laterality: Left;  . SPINAL FUSION  1994   At C3-5     SOCIAL HISTORY:  Social History   Socioeconomic History  . Marital status: Married    Spouse name: Not on file  . Number of children: Not on file  . Years of education: Not on file  . Highest education level: Not on file  Occupational History  . Not on file  Social Needs  . Financial resource strain: Not on file  . Food insecurity:    Worry: Not on file    Inability: Not on file  . Transportation needs:    Medical: Not on file    Non-medical: Not on file  Tobacco Use  . Smoking status: Former Smoker    Years: 25.00    Types: Cigarettes    Last attempt to  quit: 02/24/2008    Years since quitting: 9.7  . Smokeless tobacco: Never Used  Substance and Sexual Activity  . Alcohol use: Not Currently  . Drug use: No  . Sexual activity: Not on file  Lifestyle  . Physical activity:    Days per week: Not on file    Minutes per session: Not on file  . Stress: Not on file  Relationships  . Social connections:    Talks on phone: Not on file    Gets together: Not on file    Attends religious service: Not on file    Active member of club or organization: Not on file    Attends meetings of clubs or organizations: Not on file    Relationship status: Not on file  . Intimate partner violence:    Fear of current or ex partner: Not on file    Emotionally abused: Not on file    Physically abused: Not on file    Forced sexual activity: Not on file  Other Topics Concern  . Not on file  Social History Narrative   No regular exercise.    FAMILY HISTORY:  Family History  Problem Relation Age of Onset  . Heart attack Father   . Diverticulitis Mother   . Fibromyalgia Mother   . Hypertension Brother     CURRENT MEDICATIONS:  Outpatient Encounter Medications as of 11/19/2017  Medication Sig  . albuterol (PROAIR HFA) 108 (90 BASE) MCG/ACT inhaler Inhale 2 puffs into the lungs every 4 (four) hours as needed for wheezing or shortness of breath.   Marland Kitchen amLODipine (NORVASC) 10 MG tablet Take 10 mg by mouth daily.  . calcitRIOL (ROCALTROL) 0.25 MCG capsule Take 0.25 mcg by mouth daily.   . chlorpheniramine-HYDROcodone (TUSSIONEX) 10-8 MG/5ML SUER Take 5 mLs by mouth every 12 (twelve) hours as needed for cough.  . folic acid (FOLVITE) 1 MG tablet Take 1 mg by mouth daily.    Marland Kitchen lidocaine-prilocaine (EMLA) cream Apply a quarter size amount to port site 1 hour prior to chemo. Do not rub in. Cover with plastic wrap.  . metoprolol tartrate (LOPRESSOR) 25 MG tablet Take 25 mg by mouth 2 (two) times daily.   . prochlorperazine (COMPAZINE) 10 MG tablet Take 1 tablet (10  mg total) by mouth every 6 (six) hours as needed for nausea or vomiting.  . sodium bicarbonate 650 MG tablet Take 650 mg by mouth 2 (two) times daily.   . traMADol (ULTRAM) 50 MG tablet TAKE 1 TABLET BY MOUTH ONCE DAILY 60 MINUTES PRIOR TO RADIATION THERAPY  . HYDROcodone-acetaminophen (NORCO) 5-325 MG tablet Take 1 tablet by  mouth every 6 (six) hours as needed for moderate pain. (Patient not taking: Reported on 11/19/2017)  . warfarin (COUMADIN) 5 MG tablet    No facility-administered encounter medications on file as of 11/19/2017.     ALLERGIES:  Allergies  Allergen Reactions  . Sulfonamide Derivatives Rash     PHYSICAL EXAM:  ECOG Performance status: 1  Vitals:   11/19/17 0920  BP: 108/68  Pulse: 84  Resp: 16  Temp: 98.4 F (36.9 C)  SpO2: 100%   Filed Weights   11/19/17 0920  Weight: 85 lb 8 oz (38.8 kg)    Physical Exam  Constitutional: She is oriented to person, place, and time.  Cardiovascular: Normal rate, regular rhythm and normal heart sounds.  Pulmonary/Chest: Effort normal and breath sounds normal.  Musculoskeletal: Normal range of motion.  Neurological: She is alert and oriented to person, place, and time.  Skin: Skin is warm and dry.  Psychiatric: She has a normal mood and affect. Her behavior is normal. Judgment and thought content normal.     LABORATORY DATA:  I have reviewed the labs as listed.  CBC    Component Value Date/Time   WBC 5.5 11/15/2017 0952   RBC 3.95 11/15/2017 0952   HGB 12.1 11/15/2017 0952   HCT 35.9 (L) 11/15/2017 0952   PLT 323 11/15/2017 0952   MCV 90.9 11/15/2017 0952   MCH 30.6 11/15/2017 0952   MCHC 33.7 11/15/2017 0952   RDW 15.0 11/15/2017 0952   LYMPHSABS 0.7 11/15/2017 0952   MONOABS 0.6 11/15/2017 0952   EOSABS 0.1 11/15/2017 0952   BASOSABS 0.0 11/15/2017 0952   CMP Latest Ref Rng & Units 11/15/2017 10/29/2017 10/14/2017  Glucose 70 - 99 mg/dL 95 100(H) 109(H)  BUN 6 - 20 mg/dL 53(H) 47(H) 49(H)  Creatinine 0.44  - 1.00 mg/dL 5.35(H) 4.52(H) 4.58(H)  Sodium 135 - 145 mmol/L 128(L) 130(L) 121(L)  Potassium 3.5 - 5.1 mmol/L 5.3(H) 4.7 3.9  Chloride 98 - 111 mmol/L 92(L) 102 84(L)  CO2 22 - 32 mmol/L 24 19(L) 24  Calcium 8.9 - 10.3 mg/dL 9.7 9.4 9.4  Total Protein 6.5 - 8.1 g/dL 7.3 6.1(L) 6.6  Total Bilirubin 0.3 - 1.2 mg/dL 0.5 0.3 0.5  Alkaline Phos 38 - 126 U/L 84 68 70  AST 15 - 41 U/L _0 ALT 0 - 44 U/L _1 ASSESSMENT & PLAN:   Malignant neoplasm of lung (HCC) 1.  Clinical stage III (T4N1) right lung poorly differentiated adenocarcinoma, EGFR negative: -Patient underwent a CT scan of the chest for work-up of kidney transplant at [facility] and was found to have spiculated solid nodule in the posterior right lower lobe measuring 1.6 x 1.2 cm with fullness in the right hilum.  On 06/15/2011 she underwent EBUS guided biopsy.  7S lymph node was negative for malignancy.  Right hilar mass was positive for carcinoma.  Endobronchial biopsy showed poorly differentiated adenocarcinoma.  She also had endobronchial tumor debulked (75% obstructing reduced to 25% obstructing). BI had tumor 1.5 cm distal to right upper lobe takeoff.  Full EBUS staging shows no adenopathy at 11 L, 10 L, 4L or 4R.  Left mainstem and segments clear.  Right upper lobe normal.  - Her hemoptysis has improved since she stopped taking Coumadin.  She takes aspirin daily.  She is on Coumadin for vasculitis and strokes. - PET CT scan dated 07/05/2017 shows 1.5 cm medial right lower  lobe nodule, and right perihilar mass with no evidence of distant metastasis.  We also talked about MRI of the brain without contrast dated 07/07/2017 which did not show any intracranial metastasis. - Chemoradiation therapy with weekly carboplatin and paclitaxel from 08/18/2017 through 10/04/2017 -We discussed the PET CT scan results dated 10/28/2017 which showed very good response to chemoradiation therapy.  She had very minimal residual SUV of  2.7 in the right perihilar mass, which could be therapy related. - We have reviewed her blood work today.  Platelet count improved to normal.  We talked about initiating her on durvalumab consolidation every 2 weeks for a year.  We talked about side effects in detail.  She will be seen back in 2 weeks for follow-up.  She will receive flu vaccine today.  2.  CKD: She has stage V CKD.  Hence she was being worked up for kidney transplant which is put on hold at this time.  She follows up with Dr. Elita Quick in [place].  3.  Anemia: -This is from a combination of CKD and prior chemotherapy.  Today her hemoglobin improved to 12.1.   4.  Cough: -  She is not requiring Tussionex on a regular basis.       Orders placed this encounter:  Orders Placed This Encounter  Procedures  . CBC with Differential/Platelet  . Comprehensive metabolic panel      Derek Jack, MD [place] [phone]

## 2017-11-19 ENCOUNTER — Inpatient Hospital Stay (HOSPITAL_COMMUNITY): Payer: Managed Care, Other (non HMO)

## 2017-11-19 ENCOUNTER — Other Ambulatory Visit: Payer: Self-pay

## 2017-11-19 ENCOUNTER — Other Ambulatory Visit (HOSPITAL_COMMUNITY): Payer: Managed Care, Other (non HMO)

## 2017-11-19 ENCOUNTER — Encounter (HOSPITAL_COMMUNITY): Payer: Self-pay | Admitting: Hematology

## 2017-11-19 ENCOUNTER — Inpatient Hospital Stay (HOSPITAL_BASED_OUTPATIENT_CLINIC_OR_DEPARTMENT_OTHER): Payer: Managed Care, Other (non HMO) | Admitting: Hematology

## 2017-11-19 ENCOUNTER — Telehealth (HOSPITAL_COMMUNITY): Payer: Self-pay | Admitting: Hematology

## 2017-11-19 DIAGNOSIS — C3401 Malignant neoplasm of right main bronchus: Secondary | ICD-10-CM

## 2017-11-19 DIAGNOSIS — Z79899 Other long term (current) drug therapy: Secondary | ICD-10-CM

## 2017-11-19 DIAGNOSIS — D631 Anemia in chronic kidney disease: Secondary | ICD-10-CM

## 2017-11-19 DIAGNOSIS — N185 Chronic kidney disease, stage 5: Secondary | ICD-10-CM

## 2017-11-19 DIAGNOSIS — C3431 Malignant neoplasm of lower lobe, right bronchus or lung: Secondary | ICD-10-CM

## 2017-11-19 MED ORDER — INFLUENZA VAC SPLIT QUAD 0.5 ML IM SUSY: 0.5000 mL | PREFILLED_SYRINGE | Freq: Once | INTRAMUSCULAR | Status: DC

## 2017-11-19 NOTE — Patient Instructions (Addendum)
[place] at [facility] Discharge Instructions   Follow up in 2 weeks with labs.   Thank you for choosing [place] at [facility] to provide your oncology and hematology care.  To afford each patient quality time with our provider, please arrive at least 15 minutes before your scheduled appointment time.   If you have a lab appointment with the [facility] please come in thru the  Main Entrance and check in at the main information desk  You need to re-schedule your appointment should you arrive 10 or more minutes late.  We strive to give you quality time with our providers, and arriving late affects you and other patients whose appointments are after yours.  Also, if you no show three or more times for appointments you may be dismissed from the clinic at the providers discretion.     Again, thank you for choosing [facility].  Our hope is that these requests will decrease the amount of time that you wait before being seen by our physicians.       _____________________________________________________________  Should you have questions after your visit to [facility], please contact our office at [phone] between the hours of 8:00 a.m. and 4:30 p.m.  Voicemails left after 4:00 p.m. will not be returned until the following business day.  For prescription refill requests, have your pharmacy contact our office and allow 72 hours.    Cancer Center Support Programs:   > Cancer Support Group  2nd Tuesday of the month 1pm-2pm, Journey Room

## 2017-11-19 NOTE — Assessment & Plan Note (Addendum)
1.  Clinical stage III (T4N1) right lung poorly differentiated adenocarcinoma, EGFR negative: -Patient underwent a CT scan of the chest for work-up of kidney transplant at [facility] and was found to have spiculated solid nodule in the posterior right lower lobe measuring 1.6 x 1.2 cm with fullness in the right hilum.  On 06/15/2011 she underwent EBUS guided biopsy.  7S lymph node was negative for malignancy.  Right hilar mass was positive for carcinoma.  Endobronchial biopsy showed poorly differentiated adenocarcinoma.  She also had endobronchial tumor debulked (75% obstructing reduced to 25% obstructing). BI had tumor 1.5 cm distal to right upper lobe takeoff.  Full EBUS staging shows no adenopathy at 11 L, 10 L, 4L or 4R.  Left mainstem and segments clear.  Right upper lobe normal.  - Her hemoptysis has improved since she stopped taking Coumadin.  She takes aspirin daily.  She is on Coumadin for vasculitis and strokes. - PET CT scan dated 07/05/2017 shows 1.5 cm medial right lower lobe nodule, and right perihilar mass with no evidence of distant metastasis.  We also talked about MRI of the brain without contrast dated 07/07/2017 which did not show any intracranial metastasis. - Chemoradiation therapy with weekly carboplatin and paclitaxel from 08/18/2017 through 10/04/2017 -We discussed the PET CT scan results dated 10/28/2017 which showed very good response to chemoradiation therapy.  She had very minimal residual SUV of 2.7 in the right perihilar mass, which could be therapy related. - We have reviewed her blood work today.  Platelet count improved to normal.  We talked about initiating her on durvalumab consolidation every 2 weeks for a year.  We talked about side effects in detail.  She will be seen back in 2 weeks for follow-up.  She will receive flu vaccine today.  2.  CKD: She has stage V CKD.  Hence she was being worked up for kidney transplant which is put on hold at this time.  She follows up with Dr.  Elita Quick in [place].  3.  Anemia: -This is from a combination of CKD and prior chemotherapy.  Today her hemoglobin improved to 12.1.   4.  Cough: -  She is not requiring Tussionex on a regular basis.

## 2017-11-19 NOTE — Progress Notes (Signed)
DISCONTINUE ON PATHWAY REGIMEN - Non-Small Cell Lung     Administer weekly:     Paclitaxel      Carboplatin   **Always confirm dose/schedule in your pharmacy ordering system**  REASON: Continuation Of Treatment PRIOR TREATMENT: [id number]: Carboplatin AUC=2 + Paclitaxel 45 mg/m2 Weekly During Radiation TREATMENT RESPONSE: Partial Response (PR)  START ON PATHWAY REGIMEN - Non-Small Cell Lung     A cycle is every 14 days:     Durvalumab   **Always confirm dose/schedule in your pharmacy ordering system**  Patient Characteristics: Stage III - Unresectable, PS = 0, 1 AJCC T Category: T4 Current Disease Status: No Distant Mets or Local Recurrence AJCC N Category: N1 AJCC M Category: M0 AJCC 8 Stage Grouping: IIIA Performance Status: PS = 0, 1 Intent of Therapy: Non-Curative / Palliative Intent, Discussed with Patient

## 2017-11-19 NOTE — Progress Notes (Signed)
[place] held today due to no authorization at this time. Pt to return next week

## 2017-11-19 NOTE — Telephone Encounter (Signed)
Per pt she has [employer] coverage until 10/31 and then she will only have [place] Medicaid which is non par with [facility] providers etc. Contacted [employer] [phone] to start a case(#[phone]) for [phone]. Per csr pts plan termed on 09/24/17 was transferred to the eligibility dept and they also showed that the pt was not eligible.  Spoke with pt and husband and advised them to contact their HR dept to have eligibility issue corrected. After that is corrected I will continue the request for auth.

## 2017-11-23 ENCOUNTER — Telehealth (HOSPITAL_COMMUNITY): Payer: Self-pay | Admitting: Hematology

## 2017-11-23 NOTE — Telephone Encounter (Signed)
PC TO [employer] TO SUBMIT ANOTHER REQ FOR CHEMO MED. PTS ELIG ISSUE STILL NOT CORRECTED. PER CSR WILL TAKE 24 HRS TO SHOW CORRECTED IN THEIR SYSTEM

## 2017-11-24 ENCOUNTER — Telehealth (HOSPITAL_COMMUNITY): Payer: Self-pay | Admitting: Hematology

## 2017-11-26 ENCOUNTER — Ambulatory Visit (HOSPITAL_COMMUNITY): Payer: Medicaid - Out of State

## 2017-11-26 ENCOUNTER — Other Ambulatory Visit (HOSPITAL_COMMUNITY): Payer: Medicaid - Out of State

## 2017-11-30 ENCOUNTER — Telehealth (HOSPITAL_COMMUNITY): Payer: Self-pay | Admitting: Hematology

## 2017-11-30 NOTE — Telephone Encounter (Signed)
Pts eligibility issue was corrected by their HR dept. Was able to finally submit a PA request for Imfinzi. Pending MD review Case # [id number] [phone]) [phone])

## 2017-12-02 ENCOUNTER — Telehealth (HOSPITAL_COMMUNITY): Payer: Self-pay | Admitting: Hematology

## 2017-12-02 NOTE — Telephone Encounter (Signed)
FAXED ADDITIONAL MED RECS TO [employer]

## 2017-12-03 ENCOUNTER — Other Ambulatory Visit (HOSPITAL_COMMUNITY): Payer: Medicaid - Out of State

## 2017-12-03 ENCOUNTER — Ambulatory Visit (HOSPITAL_COMMUNITY): Payer: Medicaid - Out of State

## 2017-12-03 ENCOUNTER — Ambulatory Visit (HOSPITAL_COMMUNITY): Payer: Medicaid - Out of State | Admitting: Hematology

## 2017-12-07 ENCOUNTER — Encounter (HOSPITAL_COMMUNITY): Payer: Self-pay

## 2017-12-07 ENCOUNTER — Inpatient Hospital Stay (HOSPITAL_COMMUNITY): Payer: Managed Care, Other (non HMO)

## 2017-12-07 ENCOUNTER — Inpatient Hospital Stay (HOSPITAL_COMMUNITY): Payer: Managed Care, Other (non HMO) | Attending: Nurse Practitioner

## 2017-12-07 ENCOUNTER — Telehealth (HOSPITAL_COMMUNITY): Payer: Self-pay | Admitting: Hematology

## 2017-12-07 VITALS — BP 151/59 | HR 69 | Temp 98.2°F | Resp 16 | Wt 87.1 lb

## 2017-12-07 DIAGNOSIS — C3431 Malignant neoplasm of lower lobe, right bronchus or lung: Secondary | ICD-10-CM | POA: Diagnosis present

## 2017-12-07 DIAGNOSIS — E0500 Thyrotoxicosis with diffuse goiter without thyrotoxic crisis or storm: Secondary | ICD-10-CM | POA: Insufficient documentation

## 2017-12-07 DIAGNOSIS — Z5112 Encounter for antineoplastic immunotherapy: Secondary | ICD-10-CM | POA: Insufficient documentation

## 2017-12-07 DIAGNOSIS — Z23 Encounter for immunization: Secondary | ICD-10-CM | POA: Insufficient documentation

## 2017-12-07 DIAGNOSIS — N184 Chronic kidney disease, stage 4 (severe): Secondary | ICD-10-CM | POA: Diagnosis not present

## 2017-12-07 DIAGNOSIS — I129 Hypertensive chronic kidney disease with stage 1 through stage 4 chronic kidney disease, or unspecified chronic kidney disease: Secondary | ICD-10-CM | POA: Diagnosis not present

## 2017-12-07 LAB — CBC WITH DIFFERENTIAL/PLATELET
Abs Immature Granulocytes: 0.05 10*3/uL (ref 0.00–0.07)
Basophils Absolute: 0.1 10*3/uL (ref 0.0–0.1)
Basophils Relative: 1 %
EOS ABS: 0.5 10*3/uL (ref 0.0–0.5)
Eosinophils Relative: 5 %
HEMATOCRIT: 30.7 % — AB (ref 36.0–46.0)
HEMOGLOBIN: 9.7 g/dL — AB (ref 12.0–15.0)
Immature Granulocytes: 1 %
LYMPHS ABS: 0.7 10*3/uL (ref 0.7–4.0)
LYMPHS PCT: 7 %
MCH: 30 pg (ref 26.0–34.0)
MCHC: 31.6 g/dL (ref 30.0–36.0)
MCV: 95 fL (ref 80.0–100.0)
Monocytes Absolute: 0.8 10*3/uL (ref 0.1–1.0)
Monocytes Relative: 8 %
Neutro Abs: 7.7 10*3/uL (ref 1.7–7.7)
Neutrophils Relative %: 78 %
Platelets: 320 10*3/uL (ref 150–400)
RBC: 3.23 MIL/uL — ABNORMAL LOW (ref 3.87–5.11)
RDW: 16.5 % — AB (ref 11.5–15.5)
WBC: 9.8 10*3/uL (ref 4.0–10.5)
nRBC: 0 % (ref 0.0–0.2)

## 2017-12-07 LAB — COMPREHENSIVE METABOLIC PANEL
ALK PHOS: 78 U/L (ref 38–126)
ALT: 10 U/L (ref 0–44)
ANION GAP: 12 (ref 5–15)
AST: 19 U/L (ref 15–41)
Albumin: 3.6 g/dL (ref 3.5–5.0)
BILIRUBIN TOTAL: 0.5 mg/dL (ref 0.3–1.2)
BUN: 57 mg/dL — AB (ref 6–20)
CALCIUM: 9.4 mg/dL (ref 8.9–10.3)
CO2: 22 mmol/L (ref 22–32)
Chloride: 94 mmol/L — ABNORMAL LOW (ref 98–111)
Creatinine, Ser: 5.44 mg/dL — ABNORMAL HIGH (ref 0.44–1.00)
GFR calc Af Amer: 9 mL/min — ABNORMAL LOW (ref >60)
GFR calc non Af Amer: 8 mL/min — ABNORMAL LOW (ref >60)
GLUCOSE: 94 mg/dL (ref 70–99)
POTASSIUM: 5.1 mmol/L (ref 3.5–5.1)
Sodium: 128 mmol/L — ABNORMAL LOW (ref 135–145)
Total Protein: 7.2 g/dL (ref 6.5–8.1)

## 2017-12-07 MED ORDER — SODIUM CHLORIDE 0.9 % IV SOLN
9.2000 mg/kg | Freq: Once | INTRAVENOUS | Status: AC
  Administered 2017-12-07: 360 mg via INTRAVENOUS
  Filled 2017-12-07: qty 7.2

## 2017-12-07 MED ORDER — INFLUENZA VAC SPLIT QUAD 0.5 ML IM SUSY
PREFILLED_SYRINGE | INTRAMUSCULAR | Status: AC
  Filled 2017-12-07: qty 0.5

## 2017-12-07 MED ORDER — HEPARIN SOD (PORK) LOCK FLUSH 100 UNIT/ML IV SOLN
500.0000 [IU] | Freq: Once | INTRAVENOUS | Status: AC | PRN
  Administered 2017-12-07: 500 [IU]

## 2017-12-07 MED ORDER — INFLUENZA VAC SPLIT QUAD 0.5 ML IM SUSY
0.5000 mL | PREFILLED_SYRINGE | Freq: Once | INTRAMUSCULAR | Status: AC
  Administered 2017-12-07: 0.5 mL via INTRAMUSCULAR

## 2017-12-07 MED ORDER — SODIUM CHLORIDE 0.9% FLUSH
10.0000 mL | INTRAVENOUS | Status: DC | PRN
  Administered 2017-12-07: 10 mL
  Filled 2017-12-07: qty 10

## 2017-12-07 MED ORDER — SODIUM CHLORIDE 0.9 % IV SOLN
Freq: Once | INTRAVENOUS | Status: AC
  Administered 2017-12-07: 12:00:00 via INTRAVENOUS

## 2017-12-07 NOTE — Patient Instructions (Signed)
[place] Discharge Instructions for Patients Receiving Chemotherapy  Today you received the following chemotherapy agents imfinzi.    If you develop nausea and vomiting that is not controlled by your nausea medication, call the clinic.   BELOW ARE SYMPTOMS THAT SHOULD BE REPORTED IMMEDIATELY:  *FEVER GREATER THAN 100.5 F  *CHILLS WITH OR WITHOUT FEVER  NAUSEA AND VOMITING THAT IS NOT CONTROLLED WITH YOUR NAUSEA MEDICATION  *UNUSUAL SHORTNESS OF BREATH  *UNUSUAL BRUISING OR BLEEDING  TENDERNESS IN MOUTH AND THROAT WITH OR WITHOUT PRESENCE OF ULCERS  *URINARY PROBLEMS  *BOWEL PROBLEMS  UNUSUAL RASH Items with * indicate a potential emergency and should be followed up as soon as possible.  Feel free to call the clinic should you have any questions or concerns. The clinic phone number is [phone].  Please show the [place] at check-in to the Emergency Department and triage nurse.

## 2017-12-07 NOTE — Telephone Encounter (Signed)
[employer] CASE MGR Jori Moll RN [phone] F [email or web address]

## 2017-12-07 NOTE — Progress Notes (Signed)
Reviewed labs with Dr. Delton Coombes Serum Creatinine 5.44 and NA 128 with verbal order Ok to treat today.   Consent signed for Imfinzi with written information given.  All questions asked and answered.  Family at side.    Patient tolerated treatment with no complaints voiced.  Port site clean and dry with good blood return noted.  Band aid applied.  VSS with discharge and left ambulatory with family.  No s/s of distress noted.

## 2017-12-10 ENCOUNTER — Other Ambulatory Visit (HOSPITAL_COMMUNITY): Payer: Medicaid - Out of State

## 2017-12-10 ENCOUNTER — Ambulatory Visit (HOSPITAL_COMMUNITY): Payer: Medicaid - Out of State

## 2017-12-10 ENCOUNTER — Ambulatory Visit (HOSPITAL_COMMUNITY): Payer: Medicaid - Out of State | Admitting: Oncology

## 2017-12-13 NOTE — Assessment & Plan Note (Addendum)
Clinical Stage III (T4N1M0) NSCLC, adenocarcinoma type, of right lung, RLL, EGFR -.  Patient presented after undergoing CT scan of chest for work-u associated with renal transplant [facility] was not found at that time to have a spiculated solid nodule in the posterior right lower lobe measuring 1.6 cm.  She is S/P EBUS guided biopsy on 06/14/2017 showing a 7S LN was negative for malignancy but biopsy-proven disease of right hilar mass showing poorly-differentiated malignancy.  She has endobronchial tumor debulked (75% obstructing reduced to 25% obstructing).  Staging PET scan on 07/05/2017 demonstrated a 1.5 cm medial right lower lobe nodule, right perihilar mass without distant metastatic disease.  MRI brain on 07/07/2017 was negative for intracranial metastatic disease.  She is S/P chemoXRT with weekly Carboplatin/Paclitaxel (08/18/2017- 10/04/2017).  Restaging PET scan on 10/28/2017 demonstrated good response to therapy with minimal residual SUV of 2.7 in the right perihilar mass.  She is now on consolidative durvalumab therapy beginning on 12/07/2017.  She will take this every 2 weeks for up to 52 weeks.  Of note, she has a baseline and chronic cough.  I have discussed starting immunotherapy, namely Imfinzi given recent data: In a phase III trial, over 700 patients with unresectable stage III NSCLC without progression after at least two cycles of platinum-based chemoradiotherapy were randomly assigned to the programmed death ligand 1 (PD-L1) antibody durvalumab or placebo in a 2:1 ratio. Relative to placebo, durvalumab increased the median PFS (16.8 versus 5.6 months; HR for disease progression or death 0.52, 95% CI 0.42-0.65), response rate (28 versus 16 percent; relative risk [RR] 1.78, 95% CI 1.27-2.51), and median time to death or distant metastasis (23.2 versus 14.6 months; HR 0.52, 95% CI 0.39-0.69). The benefit in PFS with durvalumab was observed irrespective of PD-L1 expression  before chemoradiotherapy and was evident in both smokers and nonsmokers. OS results are immature. Grade 3 or 4 adverse events occurred in 30 percent of the patients who received durvalumab and 26 percent of those who received placebo, with the most common severe adverse event being pneumonia. These results demonstrate efficacy and tolerability of durvalumab for treatment of unresectable stage III NSCLC in patients who experience an objective response or stable disease following completion of chemoradiotherapy.  Labs on 12/07/2017: CBC diff, CMET.  I personally reviewed and went over laboratory results with the patient.  The results are noted within this dictation.  Laboratory work performed on 12/07/2017 demonstrated white blood cell count of 9.8 with normal differential, hemoglobin 9.7 g/dL, and platelet count 320,000.  Metabolic panel demonstrates chronic renal disease, grade 4, with a moderate hyponatremia of 128.  TSH was within normal limits on 11/15/2017 at 2.984.  Labs in 1 week: CBC diff, CMET, TSH.  Labs in 3 weeks: CBC diff, CMET, ACTH, cortisol.  Nursing, in accordance with immunotherapy administration protocol, will monitor for acute side effects/toxicities associated with immunotherapy administration today.  She is tolerating well and denies: Side effects of Durvalumab: Peripheral edema, fatigue, skin rash, hyponatremia, constipation, decreased appetite, nausea, abdominal pain, colitis, diarrhea, tract infection, lymphocytopenia, infection, muscular skeletal pain, dyspnea, dyspnea on exertion, fever.  Return in 1 weeks for treatment and 3 weeks for follow-up with treatment.  If all is well at that time, I would consider ongoing treatment every 2 weeks with every 4 week follow-up visits.  She will be due for her next restaging scans in 01/2018.

## 2017-12-14 ENCOUNTER — Other Ambulatory Visit: Payer: Self-pay

## 2017-12-14 ENCOUNTER — Inpatient Hospital Stay (HOSPITAL_BASED_OUTPATIENT_CLINIC_OR_DEPARTMENT_OTHER): Payer: Managed Care, Other (non HMO) | Admitting: Oncology

## 2017-12-14 VITALS — BP 125/61 | HR 77 | Temp 98.2°F | Resp 20 | Wt 87.0 lb

## 2017-12-14 DIAGNOSIS — I129 Hypertensive chronic kidney disease with stage 1 through stage 4 chronic kidney disease, or unspecified chronic kidney disease: Secondary | ICD-10-CM

## 2017-12-14 DIAGNOSIS — C3431 Malignant neoplasm of lower lobe, right bronchus or lung: Secondary | ICD-10-CM | POA: Diagnosis not present

## 2017-12-14 DIAGNOSIS — E0500 Thyrotoxicosis with diffuse goiter without thyrotoxic crisis or storm: Secondary | ICD-10-CM | POA: Diagnosis not present

## 2017-12-14 DIAGNOSIS — Z5112 Encounter for antineoplastic immunotherapy: Secondary | ICD-10-CM | POA: Diagnosis not present

## 2017-12-14 DIAGNOSIS — N184 Chronic kidney disease, stage 4 (severe): Secondary | ICD-10-CM | POA: Diagnosis not present

## 2017-12-14 DIAGNOSIS — Z23 Encounter for immunization: Secondary | ICD-10-CM

## 2017-12-14 DIAGNOSIS — I10 Essential (primary) hypertension: Secondary | ICD-10-CM

## 2017-12-14 NOTE — Patient Instructions (Signed)
[name] at [facility] Discharge Instructions  Labs from 12/07/2017 reviewed. Labs next week. Treatment next week. Labs and treatment in 3 weeks. Follow-up in 3 weeks.   Thank you for choosing [place] at [facility] to provide your oncology and hematology care.  To afford each patient quality time with our provider, please arrive at least 15 minutes before your scheduled appointment time.   If you have a lab appointment with the [facility] please come in thru the  Main Entrance and check in at the main information desk  You need to re-schedule your appointment should you arrive 10 or more minutes late.  We strive to give you quality time with our providers, and arriving late affects you and other patients whose appointments are after yours.  Also, if you no show three or more times for appointments you may be dismissed from the clinic at the providers discretion.     Again, thank you for choosing [facility].  Our hope is that these requests will decrease the amount of time that you wait before being seen by our physicians.       _____________________________________________________________  Should you have questions after your visit to [facility], please contact our office at [phone] between the hours of 8:00 a.m. and 4:30 p.m.  Voicemails left after 4:00 p.m. will not be returned until the following business day.  For prescription refill requests, have your pharmacy contact our office and allow 72 hours.    Cancer Center Support Programs:   > Cancer Support Group  2nd Tuesday of the month 1pm-2pm, Journey Room

## 2017-12-14 NOTE — Progress Notes (Signed)
Tasha Chroman, MD [place] [address]  Malignant neoplasm of lower lobe of right lung ([place]) - Plan: CBC with Differential, Comprehensive metabolic panel, TSH, CBC with Differential, Comprehensive metabolic panel, ACTH, Cortisol  Encounter for antineoplastic immunotherapy  Chronic kidney disease (CKD) stage G4  Essential hypertension  Graves disease   HISTORY OF PRESENT ILLNESS: Clinical Stage III (T4N1M0) NSCLC, adenocarcinoma type, of right lung, RLL, EGFR -.  Patient presented after undergoing CT scan of chest for work-u associated with renal transplant [facility] was not found at that time to have a spiculated solid nodule in the posterior right lower lobe measuring 1.6 cm.  She is S/P EBUS guided biopsy on 06/14/2017 showing a 7S LN was negative for malignancy but biopsy-proven disease of right hilar mass showing poorly-differentiated malignancy.  She has endobronchial tumor debulked (75% obstructing reduced to 25% obstructing).  Staging PET scan on 07/05/2017 demonstrated a 1.5 cm medial right lower lobe nodule, right perihilar mass without distant metastatic disease.  MRI brain on 07/07/2017 was negative for intracranial metastatic disease.  She is S/P chemoXRT with weekly Carboplatin/Paclitaxel (08/18/2017- 10/04/2017).  Restaging PET scan on 10/28/2017 demonstrated good response to therapy with minimal residual SUV of 2.7 in the right perihilar mass.  She is now on consolidative durvalumab therapy beginning on 12/07/2017.  She will take this every 2 weeks for up to 52 weeks.  Of note, she has a baseline and chronic cough.  CURRENT THERAPY: Consolidative durvalumab for up to 52 weeks.  CURRENT STATUS: Tasha [name] 52 y.o. female returns for followup of clinical stage III adenocarcinoma of right lung.  She tolerated her first cycle of consolidative durvalumab without issue.  Specifically, she denies any diarrhea, severe fatigue, and rash.  Her  appetite is good and her weight is stable.  She denies any new pain.  She denies any new lumps or bumps on her own examination.  She denies any new cough or hemoptysis.  She denies shortness of breath.  Unfortunately, she will need to transfer her medical oncology care to [place] due to insurance purposes.  I provided her our assistance to help with his transfer if needed.  Review of Systems  Constitutional: Negative.  Negative for chills, fever and weight loss.  HENT: Negative.   Eyes: Negative.   Respiratory: Negative.  Negative for cough.   Cardiovascular: Negative.  Negative for chest pain.  Gastrointestinal: Negative.  Negative for blood in stool, constipation, diarrhea, melena, nausea and vomiting.  Genitourinary: Negative.   Musculoskeletal: Negative.   Skin: Negative.   Neurological: Negative.  Negative for weakness.  Endo/Heme/Allergies: Negative.   Psychiatric/Behavioral: Negative.     Past Medical History:  Diagnosis Date  . Anemia   . Aortic insufficiency   . Asthma   . Breast nodule   . Carotid artery disease ([place])   . 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 CKD (chronic kidney disease) stage 4, GFR 15-29 ml/min (HCC)   . COPD (chronic obstructive pulmonary disease) ([place])   . CRI (chronic renal insufficiency)   . Folic acid deficiency   . Glaucoma   . Graves disease   . Hyperlipidemia   . Hypertension   . Iron deficiency   . Lung cancer ([place])    non-small cell right lung  . Recurrent UTI   . Renal cyst   . Systemic vasculitis ([place])    Not otherwise specified,  Dr. Sonny Dandy    Past Surgical History:  Procedure Laterality Date  . BRONCHOSCOPY    . COLONOSCOPY    . ORIF ORBITAL FRACTURE  1996   Left  . PORTACATH PLACEMENT Left 08/25/2017   Procedure: INSERTION PORT-A-CATH;  Surgeon: Aviva Signs, MD;  Location: [facility];  Service: General;  Laterality: Left;  . SPINAL FUSION  1994   At C3-5    Family History  Problem  Relation Age of Onset  . Heart attack Father   . Diverticulitis Mother   . Fibromyalgia Mother   . Hypertension Brother     Social History   Socioeconomic History  . Marital status: Married    Spouse name: Not on file  . Number of children: Not on file  . Years of education: Not on file  . Highest education level: Not on file  Occupational History  . Not on file  Social Needs  . Financial resource strain: Not on file  . Food insecurity:    Worry: Not on file    Inability: Not on file  . Transportation needs:    Medical: Not on file    Non-medical: Not on file  Tobacco Use  . Smoking status: Former Smoker    Years: 25.00    Types: Cigarettes    Last attempt to quit: 02/24/2008    Years since quitting: 9.8  . Smokeless tobacco: Never Used  Substance and Sexual Activity  . Alcohol use: Not Currently  . Drug use: No  . Sexual activity: Not on file  Lifestyle  . Physical activity:    Days per week: Not on file    Minutes per session: Not on file  . Stress: Not on file  Relationships  . Social connections:    Talks on phone: Not on file    Gets together: Not on file    Attends religious service: Not on file    Active member of club or organization: Not on file    Attends meetings of clubs or organizations: Not on file    Relationship status: Not on file  Other Topics Concern  . Not on file  Social History Narrative   No regular exercise.     PHYSICAL EXAMINATION  ECOG PERFORMANCE STATUS: 1 - Symptomatic but completely ambulatory  Vitals:   12/14/17 1320  BP: 125/61  Pulse: 77  Resp: 20  Temp: 98.2 F (36.8 C)  SpO2: 100%    GENERAL:alert, no distress, well nourished, well developed, comfortable, cooperative, smiling and accompanied by her husband SKIN: skin color, texture, turgor are normal, no rashes or significant lesions HEAD: Normocephalic, No masses, lesions, tenderness or abnormalities EYES: normal, EOMI, Conjunctiva are pink and  non-injected EARS: External ears normal OROPHARYNX:lips, buccal mucosa, and tongue normal  NECK: supple, no adenopathy, thyroid normal size, non-tender, without nodularity, trachea midline LYMPH:  no palpable lymphadenopathy BREAST:not examined LUNGS: clear to auscultation  HEART: regular rate & rhythm ABDOMEN:abdomen soft, non-tender and normal bowel sounds BACK: Back symmetric, no curvature. EXTREMITIES:less then 2 second capillary refill, no joint deformities, effusion, or inflammation, no skin discoloration, no cyanosis  NEURO: alert & oriented x 3 with fluent speech, no focal motor/sensory deficits   LABORATORY DATA: CBC    Component Value Date/Time   WBC 9.8 12/07/2017 1007   RBC 3.23 (L) 12/07/2017 1007   HGB 9.7 (L) 12/07/2017 1007   HCT 30.7 (L) 12/07/2017 1007   PLT 320 12/07/2017 1007   MCV 95.0 12/07/2017 1007   MCH  30.0 12/07/2017 1007   MCHC 31.6 12/07/2017 1007   RDW 16.5 (H) 12/07/2017 1007   LYMPHSABS 0.7 12/07/2017 1007   MONOABS 0.8 12/07/2017 1007   EOSABS 0.5 12/07/2017 1007   BASOSABS 0.1 12/07/2017 1007      Chemistry      Component Value Date/Time   NA 128 (L) 12/07/2017 1007   NA 125 (A) 10/19/2015   K 5.1 12/07/2017 1007   CL 94 (L) 12/07/2017 1007   CO2 22 12/07/2017 1007   BUN 57 (H) 12/07/2017 1007   BUN 26 (A) 10/19/2015   CREATININE 5.44 (H) 12/07/2017 1007   CREATININE 3.56 (H) 06/09/2016 1354   GLU 93 10/19/2015      Component Value Date/Time   CALCIUM 9.4 12/07/2017 1007   ALKPHOS 78 12/07/2017 1007   AST 19 12/07/2017 1007   ALT 10 12/07/2017 1007   BILITOT 0.5 12/07/2017 1007     Lab Results  Component Value Date   TSH 2.984 11/15/2017    RADIOGRAPHIC STUDIES:  No results found.   PATHOLOGY:    ASSESSMENT AND PLAN:  Malignant neoplasm of lung (HCC) Clinical Stage III (T4N1M0) NSCLC, adenocarcinoma type, of right lung, RLL, EGFR -.  Patient presented after undergoing CT scan of chest for work-u associated with  renal transplant [facility] was not found at that time to have a spiculated solid nodule in the posterior right lower lobe measuring 1.6 cm.  She is S/P EBUS guided biopsy on 06/14/2017 showing a 7S LN was negative for malignancy but biopsy-proven disease of right hilar mass showing poorly-differentiated malignancy.  She has endobronchial tumor debulked (75% obstructing reduced to 25% obstructing).  Staging PET scan on 07/05/2017 demonstrated a 1.5 cm medial right lower lobe nodule, right perihilar mass without distant metastatic disease.  MRI brain on 07/07/2017 was negative for intracranial metastatic disease.  She is S/P chemoXRT with weekly Carboplatin/Paclitaxel (08/18/2017- 10/04/2017).  Restaging PET scan on 10/28/2017 demonstrated good response to therapy with minimal residual SUV of 2.7 in the right perihilar mass.  She is now on consolidative durvalumab therapy beginning on 12/07/2017.  She will take this every 2 weeks for up to 52 weeks.  Of note, she has a baseline and chronic cough.  I have discussed starting immunotherapy, namely Imfinzi given recent data: In a phase III trial, over 700 patients with unresectable stage III NSCLC without progression after at least two cycles of platinum-based chemoradiotherapy were randomly assigned to the programmed death ligand 1 (PD-L1) antibody durvalumab or placebo in a 2:1 ratio. Relative to placebo, durvalumab increased the median PFS (16.8 versus 5.6 months; HR for disease progression or death 0.52, 95% CI 0.42-0.65), response rate (28 versus 16 percent; relative risk [RR] 1.78, 95% CI 1.27-2.51), and median time to death or distant metastasis (23.2 versus 14.6 months; HR 0.52, 95% CI 0.39-0.69). The benefit in PFS with durvalumab was observed irrespective of PD-L1 expression before chemoradiotherapy and was evident in both smokers and nonsmokers. OS results are immature. Grade 3 or 4 adverse events occurred in 30 percent of the patients who  received durvalumab and 26 percent of those who received placebo, with the most common severe adverse event being pneumonia. These results demonstrate efficacy and tolerability of durvalumab for treatment of unresectable stage III NSCLC in patients who experience an objective response or stable disease following completion of chemoradiotherapy.  Labs on 12/07/2017: CBC diff, CMET.  I personally reviewed and went over laboratory results with the patient.  The results are noted within this dictation.  Laboratory work performed on 12/07/2017 demonstrated white blood cell count of 9.8 with normal differential, hemoglobin 9.7 g/dL, and platelet count 320,000.  Metabolic panel demonstrates chronic renal disease, grade 4, with a moderate hyponatremia of 128.  TSH was within normal limits on 11/15/2017 at 2.984.  Labs in 1 week: CBC diff, CMET, TSH.  Labs in 3 weeks: CBC diff, CMET, ACTH, cortisol.  Nursing, in accordance with immunotherapy administration protocol, will monitor for acute side effects/toxicities associated with immunotherapy administration today.  She is tolerating well and denies: Side effects of Durvalumab: Peripheral edema, fatigue, skin rash, hyponatremia, constipation, decreased appetite, nausea, abdominal pain, colitis, diarrhea, tract infection, lymphocytopenia, infection, muscular skeletal pain, dyspnea, dyspnea on exertion, fever.  Return in 1 weeks for treatment and 3 weeks for follow-up with treatment.  If all is well at that time, I would consider ongoing treatment every 2 weeks with every 4 week follow-up visits.  She will be due for her next restaging scans in 01/2018.    2. Encounter for antineoplastic immunotherapy She completed her first cycle of consolidative durvalumab without issue.  3. Chronic kidney disease (CKD) stage G4 Chronic and stable.  4. Essential hypertension Blood pressures well controlled at 125/61  5. Graves disease Followed by  endocrinology   ORDERS PLACED FOR THIS ENCOUNTER: Orders Placed This Encounter  Procedures  . CBC with Differential  . Comprehensive metabolic panel  . TSH  . CBC with Differential  . Comprehensive metabolic panel  . ACTH  . Cortisol    MEDICATIONS PRESCRIBED THIS ENCOUNTER: No orders of the defined types were placed in this encounter.   THERAPY PLAN:  Continue up to 52 weeks worth consolidative durvalumab.  All questions were answered. The patient knows to call the clinic with any problems, questions or concerns. We can certainly see the patient much sooner if necessary.  Patient and plan discussed with Dr. Derek Jack and she is in agreement with the aforementioned.   This note is electronically signed by: Robynn Pane, PA-C 12/14/2017 3:46 PM

## 2017-12-21 ENCOUNTER — Other Ambulatory Visit: Payer: Self-pay

## 2017-12-21 ENCOUNTER — Ambulatory Visit (HOSPITAL_COMMUNITY): Payer: Managed Care, Other (non HMO)

## 2017-12-21 ENCOUNTER — Encounter (HOSPITAL_COMMUNITY): Payer: Self-pay

## 2017-12-21 ENCOUNTER — Other Ambulatory Visit (HOSPITAL_COMMUNITY): Payer: Managed Care, Other (non HMO)

## 2017-12-21 ENCOUNTER — Inpatient Hospital Stay (HOSPITAL_COMMUNITY): Payer: Managed Care, Other (non HMO)

## 2017-12-21 DIAGNOSIS — Z5112 Encounter for antineoplastic immunotherapy: Secondary | ICD-10-CM | POA: Diagnosis not present

## 2017-12-21 DIAGNOSIS — C3490 Malignant neoplasm of unspecified part of unspecified bronchus or lung: Secondary | ICD-10-CM

## 2017-12-21 DIAGNOSIS — C3431 Malignant neoplasm of lower lobe, right bronchus or lung: Secondary | ICD-10-CM

## 2017-12-21 LAB — COMPREHENSIVE METABOLIC PANEL
ALT: 11 U/L (ref 0–44)
AST: 19 U/L (ref 15–41)
Albumin: 3.6 g/dL (ref 3.5–5.0)
Alkaline Phosphatase: 85 U/L (ref 38–126)
Anion gap: 13 (ref 5–15)
BUN: 61 mg/dL — AB (ref 6–20)
CHLORIDE: 101 mmol/L (ref 98–111)
CO2: 17 mmol/L — ABNORMAL LOW (ref 22–32)
Calcium: 9.8 mg/dL (ref 8.9–10.3)
Creatinine, Ser: 6.13 mg/dL — ABNORMAL HIGH (ref 0.44–1.00)
GFR calc Af Amer: 8 mL/min — ABNORMAL LOW (ref >60)
GFR, EST NON AFRICAN AMERICAN: 7 mL/min — AB (ref >60)
GLUCOSE: 102 mg/dL — AB (ref 70–99)
Potassium: 4.8 mmol/L (ref 3.5–5.1)
Sodium: 131 mmol/L — ABNORMAL LOW (ref 135–145)
Total Bilirubin: 0.5 mg/dL (ref 0.3–1.2)
Total Protein: 7.1 g/dL (ref 6.5–8.1)

## 2017-12-21 LAB — CBC WITH DIFFERENTIAL/PLATELET
Abs Immature Granulocytes: 0.03 10*3/uL (ref 0.00–0.07)
Basophils Absolute: 0 10*3/uL (ref 0.0–0.1)
Basophils Relative: 0 %
Eosinophils Absolute: 0.5 10*3/uL (ref 0.0–0.5)
Eosinophils Relative: 5 %
HEMATOCRIT: 28.1 % — AB (ref 36.0–46.0)
Hemoglobin: 8.9 g/dL — ABNORMAL LOW (ref 12.0–15.0)
IMMATURE GRANULOCYTES: 0 %
LYMPHS ABS: 0.7 10*3/uL (ref 0.7–4.0)
LYMPHS PCT: 7 %
MCH: 30.9 pg (ref 26.0–34.0)
MCHC: 31.7 g/dL (ref 30.0–36.0)
MCV: 97.6 fL (ref 80.0–100.0)
MONOS PCT: 7 %
Monocytes Absolute: 0.7 10*3/uL (ref 0.1–1.0)
NEUTROS ABS: 8 10*3/uL — AB (ref 1.7–7.7)
NEUTROS PCT: 81 %
Platelets: 261 10*3/uL (ref 150–400)
RBC: 2.88 MIL/uL — ABNORMAL LOW (ref 3.87–5.11)
RDW: 18.1 % — ABNORMAL HIGH (ref 11.5–15.5)
WBC: 9.9 10*3/uL (ref 4.0–10.5)
nRBC: 0 % (ref 0.0–0.2)

## 2017-12-21 MED ORDER — SODIUM CHLORIDE 0.9% FLUSH
10.0000 mL | INTRAVENOUS | Status: DC | PRN
  Administered 2017-12-21: 10 mL
  Filled 2017-12-21: qty 10

## 2017-12-21 MED ORDER — CYANOCOBALAMIN 1000 MCG/ML IJ SOLN
INTRAMUSCULAR | Status: AC
  Filled 2017-12-21: qty 1

## 2017-12-21 MED ORDER — INFLUENZA VAC SPLIT HIGH-DOSE 0.5 ML IM SUSY
PREFILLED_SYRINGE | INTRAMUSCULAR | Status: AC
  Filled 2017-12-21: qty 0.5

## 2017-12-21 MED ORDER — SODIUM CHLORIDE 0.9 % IV SOLN
Freq: Once | INTRAVENOUS | Status: AC
  Administered 2017-12-21: 12:00:00 via INTRAVENOUS

## 2017-12-21 MED ORDER — OCTREOTIDE ACETATE 30 MG IM KIT
PACK | INTRAMUSCULAR | Status: AC
  Filled 2017-12-21: qty 1

## 2017-12-21 MED ORDER — SODIUM CHLORIDE 0.9 % IV SOLN
9.4000 mg/kg | Freq: Once | INTRAVENOUS | Status: AC
  Administered 2017-12-21: 360 mg via INTRAVENOUS
  Filled 2017-12-21: qty 7.2

## 2017-12-21 MED ORDER — HEPARIN SOD (PORK) LOCK FLUSH 100 UNIT/ML IV SOLN
500.0000 [IU] | Freq: Once | INTRAVENOUS | Status: AC | PRN
  Administered 2017-12-21: 500 [IU]

## 2017-12-21 NOTE — Patient Instructions (Signed)
[facility] Discharge Instructions for Patients Receiving Chemotherapy  Today you received the following chemotherapy agents   To help prevent nausea and vomiting after your treatment, we encourage you to take your nausea medication   If you develop nausea and vomiting that is not controlled by your nausea medication, call the clinic.   BELOW ARE SYMPTOMS THAT SHOULD BE REPORTED IMMEDIATELY:  *FEVER GREATER THAN 100.5 F  *CHILLS WITH OR WITHOUT FEVER  NAUSEA AND VOMITING THAT IS NOT CONTROLLED WITH YOUR NAUSEA MEDICATION  *UNUSUAL SHORTNESS OF BREATH  *UNUSUAL BRUISING OR BLEEDING  TENDERNESS IN MOUTH AND THROAT WITH OR WITHOUT PRESENCE OF ULCERS  *URINARY PROBLEMS  *BOWEL PROBLEMS  UNUSUAL RASH Items with * indicate a potential emergency and should be followed up as soon as possible.  Feel free to call the clinic should you have any questions or concerns. The clinic phone number is [phone].  Please show the CHEMO ALERT CARD at check-in to the Emergency Department and triage nurse.   

## 2017-12-21 NOTE — Progress Notes (Signed)
Per Dr. Delton Coombes proceed with treatment. Pt requesting cough med refill by Dr. Delton Coombes. Message sent. VSS. No complaints today.    Treatment given today per MD orders. Tolerated infusion without adverse affects. Vital signs stable. No complaints at this time. Discharged from clinic ambulatory. F/U with [facility] as scheduled.

## 2017-12-22 ENCOUNTER — Other Ambulatory Visit (HOSPITAL_COMMUNITY): Payer: Self-pay | Admitting: Nurse Practitioner

## 2017-12-22 ENCOUNTER — Encounter (HOSPITAL_COMMUNITY): Payer: Self-pay | Admitting: Lab

## 2017-12-22 DIAGNOSIS — C3431 Malignant neoplasm of lower lobe, right bronchus or lung: Secondary | ICD-10-CM

## 2017-12-22 MED ORDER — HYDROCOD POLST-CPM POLST ER 10-8 MG/5ML PO SUER: 5.0000 mL | Freq: Two times a day (BID) | ORAL | 0 refills | Status: DC | PRN

## 2017-12-22 NOTE — Progress Notes (Unsigned)
Referral sent to [facility]. Records faxed on 10/30

## 2018-01-04 ENCOUNTER — Ambulatory Visit (HOSPITAL_COMMUNITY): Payer: Managed Care, Other (non HMO) | Admitting: Hematology

## 2018-01-04 ENCOUNTER — Ambulatory Visit (HOSPITAL_COMMUNITY): Payer: Managed Care, Other (non HMO)

## 2018-01-04 ENCOUNTER — Other Ambulatory Visit (HOSPITAL_COMMUNITY): Payer: Managed Care, Other (non HMO)

## 2019-08-13 IMAGING — CT NM PET TUM IMG RESTAG (PS) SKULL BASE T - THIGH
1 of 8 series · 1 of 25 positions shown · non-contrast
Comparison: PET-CT 07/05/2017

CLINICAL DATA: Subsequent treatment strategy for RIGHT lower lobe
lung carcinoma.

EXAM:
NUCLEAR MEDICINE PET SKULL BASE TO THIGH
TECHNIQUE: 4.9 mCi F-18 FDG was injected intravenously. Full-ring PET imaging
was performed from the skull base to thigh after the radiotracer. CT
data was obtained and used for attenuation correction and anatomic
localization.
Fasting blood glucose: 95 mg/dl

[Series 4: ct sk_thigh 5.0 b31f · axial · 5.0mm · 0.84mm/px · 1 of 186 slices shown]
[im 186/186  brain]
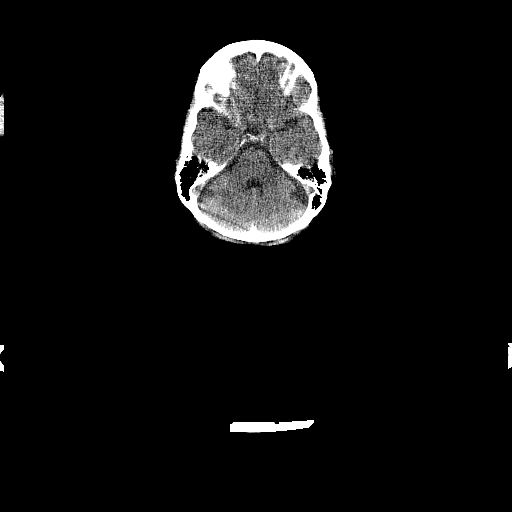

[1 of 25 positions shown; findings below may reference images not displayed]

FINDINGS: Mediastinal blood pool activity: SUV max

NECK: No hypermetabolic lymph nodes in the neck.

Incidental CT findings: none

CHEST: Resolution of hypermetabolic activity within the RIGHT upper
lobe mass and RIGHT hilum. Mild activity remains in the RIGHT hilum
with SUV max equal 2.9 which is favored benign post therapy
inflammation.

There are several nodular densities in the RIGHT upper lobe without
mild metabolic activity. Example 10 mm nodule on image [DATE] with SUV
max equal 1.7. Adjacent nodule SUV max equal 1.4 is equal size.

Nodule more anteriorly and some banding consolidation (image [DATE])
also with mild metabolic activity (SUV max equal 2.6).

Incidental CT findings:

There is a moderate RIGHT effusion.

LEFT lung is clear.

No hypermetabolic mediastinal adenopathy

none

ABDOMEN/PELVIS: No abnormal hypermetabolic activity within the
liver, pancreas, adrenal glands, or spleen. No hypermetabolic lymph
nodes in the abdomen or pelvis.

Incidental CT findings: Uterus normal. Atherosclerotic calcification
of the aorta.

SKELETON: No focal hypermetabolic activity to suggest skeletal
metastasis.

Incidental CT findings: none
IMPRESSION: 1. Resolution hypermetabolic nodule and hypermetabolic RIGHT hilar
mass compared to comparison PET-CT scan.
2. Several discrete nodules and focal consolidation in the RIGHT
upper lobe remain with very low metabolic activity. Favor post
therapy infection or inflammation. Recommend attention on follow-up.
3. Mild activity in the RIGHT hilum is favored post therapy
inflammation.
4. No evidence of new or metastatic disease.

## 2021-09-23 DEATH — deceased
